# Patient Record
Sex: Female | Born: 1945 | Race: White | Hispanic: No | Marital: Married | Smoking: Former smoker
Health system: Southern US, Community
[De-identification: ages and names within clinical notes are randomized; demographics above are authoritative.]

## PROBLEM LIST (undated history)

## (undated) HISTORY — PX: APPENDECTOMY: SHX54

## (undated) HISTORY — PX: WISDOM TOOTH EXTRACTION: SHX21

---

## 1999-04-17 ENCOUNTER — Other Ambulatory Visit: Admission: RE | Admit: 1999-04-17 | Discharge: 1999-04-17 | Payer: Self-pay | Admitting: Obstetrics and Gynecology

## 2001-02-10 ENCOUNTER — Other Ambulatory Visit: Admission: RE | Admit: 2001-02-10 | Discharge: 2001-02-10 | Payer: Self-pay | Admitting: Obstetrics and Gynecology

## 2003-02-27 ENCOUNTER — Other Ambulatory Visit: Admission: RE | Admit: 2003-02-27 | Discharge: 2003-02-27 | Payer: Self-pay | Admitting: Obstetrics and Gynecology

## 2004-09-14 ENCOUNTER — Ambulatory Visit: Payer: Self-pay | Admitting: Internal Medicine

## 2004-10-13 ENCOUNTER — Ambulatory Visit: Payer: Self-pay | Admitting: Internal Medicine

## 2004-12-23 ENCOUNTER — Other Ambulatory Visit: Admission: RE | Admit: 2004-12-23 | Discharge: 2004-12-23 | Payer: Self-pay | Admitting: Obstetrics and Gynecology

## 2004-12-24 ENCOUNTER — Ambulatory Visit: Payer: Self-pay | Admitting: Internal Medicine

## 2004-12-24 ENCOUNTER — Encounter: Admission: RE | Admit: 2004-12-24 | Discharge: 2004-12-24 | Payer: Self-pay | Admitting: Obstetrics and Gynecology

## 2004-12-31 ENCOUNTER — Ambulatory Visit: Payer: Self-pay | Admitting: Internal Medicine

## 2006-01-26 ENCOUNTER — Ambulatory Visit: Payer: Self-pay | Admitting: Internal Medicine

## 2006-02-02 ENCOUNTER — Ambulatory Visit: Payer: Self-pay | Admitting: Internal Medicine

## 2006-04-28 ENCOUNTER — Ambulatory Visit: Payer: Self-pay | Admitting: Internal Medicine

## 2006-05-05 ENCOUNTER — Ambulatory Visit: Payer: Self-pay | Admitting: Internal Medicine

## 2007-02-08 ENCOUNTER — Ambulatory Visit: Payer: Self-pay | Admitting: Internal Medicine

## 2007-02-08 LAB — CONVERTED CEMR LAB
ALT: 14 units/L (ref 0–35)
AST: 19 units/L (ref 0–37)
Albumin: 3.6 g/dL (ref 3.5–5.2)
Alkaline Phosphatase: 50 units/L (ref 39–117)
BUN: 14 mg/dL (ref 6–23)
Basophils Absolute: 0 10*3/uL (ref 0.0–0.1)
Basophils Relative: 0 % (ref 0.0–1.0)
Bilirubin, Direct: 0.1 mg/dL (ref 0.0–0.3)
CO2: 30 meq/L (ref 19–32)
Calcium: 8.8 mg/dL (ref 8.4–10.5)
Chloride: 103 meq/L (ref 96–112)
Cholesterol: 269 mg/dL (ref 0–200)
Creatinine, Ser: 0.7 mg/dL (ref 0.4–1.2)
Direct LDL: 173.7 mg/dL
Eosinophils Absolute: 0.1 10*3/uL (ref 0.0–0.6)
Eosinophils Relative: 2.3 % (ref 0.0–5.0)
GFR calc Af Amer: 110 mL/min
GFR calc non Af Amer: 91 mL/min
Glucose, Bld: 78 mg/dL (ref 70–99)
HCT: 40.4 % (ref 36.0–46.0)
HDL: 65.7 mg/dL (ref >39.0)
Hemoglobin: 13.9 g/dL (ref 12.0–15.0)
Lymphocytes Relative: 36.7 % (ref 12.0–46.0)
MCHC: 34.5 g/dL (ref 30.0–36.0)
MCV: 87.4 fL (ref 78.0–100.0)
Monocytes Absolute: 0.3 10*3/uL (ref 0.2–0.7)
Monocytes Relative: 5.4 % (ref 3.0–11.0)
Neutro Abs: 3 10*3/uL (ref 1.4–7.7)
Neutrophils Relative %: 55.6 % (ref 43.0–77.0)
Platelets: 329 10*3/uL (ref 150–400)
Potassium: 4.2 meq/L (ref 3.5–5.1)
RBC: 4.62 M/uL (ref 3.87–5.11)
RDW: 12.3 % (ref 11.5–14.6)
Sodium: 142 meq/L (ref 135–145)
TSH: 1.84 microintl units/mL (ref 0.35–5.50)
Total Bilirubin: 0.6 mg/dL (ref 0.3–1.2)
Total CHOL/HDL Ratio: 4.1
Total Protein: 6.9 g/dL (ref 6.0–8.3)
Triglycerides: 63 mg/dL (ref 0–149)
VLDL: 13 mg/dL (ref 0–40)
WBC: 5.3 10*3/uL (ref 4.5–10.5)

## 2007-02-13 ENCOUNTER — Ambulatory Visit: Payer: Self-pay | Admitting: Internal Medicine

## 2007-02-24 DIAGNOSIS — K5730 Diverticulosis of large intestine without perforation or abscess without bleeding: Secondary | ICD-10-CM | POA: Insufficient documentation

## 2007-02-24 DIAGNOSIS — E785 Hyperlipidemia, unspecified: Secondary | ICD-10-CM | POA: Insufficient documentation

## 2008-02-06 ENCOUNTER — Ambulatory Visit: Payer: Self-pay | Admitting: Internal Medicine

## 2008-02-06 DIAGNOSIS — M25519 Pain in unspecified shoulder: Secondary | ICD-10-CM | POA: Insufficient documentation

## 2009-03-06 ENCOUNTER — Ambulatory Visit: Payer: Self-pay | Admitting: Internal Medicine

## 2009-03-06 DIAGNOSIS — M6740 Ganglion, unspecified site: Secondary | ICD-10-CM

## 2009-03-07 ENCOUNTER — Telehealth: Payer: Self-pay | Admitting: Internal Medicine

## 2010-08-25 ENCOUNTER — Ambulatory Visit
Admission: RE | Admit: 2010-08-25 | Discharge: 2010-08-25 | Payer: Self-pay | Source: Home / Self Care | Attending: Internal Medicine | Admitting: Internal Medicine

## 2010-09-17 NOTE — Assessment & Plan Note (Signed)
Summary: lump on wrist//ccm   Vital Signs:  Patient profile:   65 year old female Height:      60 inches Weight:      154 pounds BMI:     30.18 Temp:     99.7 degrees F oral BP sitting:   122 / 90  (left arm) Cuff size:   regular  Vitals Entered By: Azucena Freed  MA Student CC: ganglion cyst on lt wrist x 1.5 yrs Is Patient Diabetic? No   CC:  ganglion cyst on lt wrist x 1.5 yrs.  Current Medications (verified): 1)  None  Allergies (verified): No Known Drug Allergies   Impression & Recommendations:  Problem # 1:  GANGLION CYST, WRIST, LEFT (ICD-727.41) 15 minute discussion all face to face  she has a small ganglion cys she is reassured---no need for further evaluation   Orders Added: 1)  Est. Patient Level III [16109]

## 2012-12-07 ENCOUNTER — Encounter: Payer: Self-pay | Admitting: Gastroenterology

## 2013-11-09 DIAGNOSIS — H35379 Puckering of macula, unspecified eye: Secondary | ICD-10-CM | POA: Diagnosis not present

## 2013-11-09 DIAGNOSIS — H2510 Age-related nuclear cataract, unspecified eye: Secondary | ICD-10-CM | POA: Diagnosis not present

## 2013-11-13 DIAGNOSIS — H35359 Cystoid macular degeneration, unspecified eye: Secondary | ICD-10-CM | POA: Diagnosis not present

## 2013-11-13 DIAGNOSIS — IMO0002 Reserved for concepts with insufficient information to code with codable children: Secondary | ICD-10-CM | POA: Diagnosis not present

## 2013-11-13 DIAGNOSIS — H35379 Puckering of macula, unspecified eye: Secondary | ICD-10-CM | POA: Diagnosis not present

## 2013-11-19 DIAGNOSIS — H35359 Cystoid macular degeneration, unspecified eye: Secondary | ICD-10-CM | POA: Diagnosis not present

## 2013-11-19 DIAGNOSIS — H269 Unspecified cataract: Secondary | ICD-10-CM | POA: Diagnosis not present

## 2013-11-19 DIAGNOSIS — H35379 Puckering of macula, unspecified eye: Secondary | ICD-10-CM | POA: Diagnosis not present

## 2013-12-21 DIAGNOSIS — J00 Acute nasopharyngitis [common cold]: Secondary | ICD-10-CM | POA: Diagnosis not present

## 2014-01-29 DIAGNOSIS — H2510 Age-related nuclear cataract, unspecified eye: Secondary | ICD-10-CM | POA: Diagnosis not present

## 2014-02-18 ENCOUNTER — Other Ambulatory Visit: Payer: Self-pay | Admitting: Dermatology

## 2014-02-18 DIAGNOSIS — Z85820 Personal history of malignant melanoma of skin: Secondary | ICD-10-CM | POA: Diagnosis not present

## 2014-02-18 DIAGNOSIS — L851 Acquired keratosis [keratoderma] palmaris et plantaris: Secondary | ICD-10-CM | POA: Diagnosis not present

## 2014-02-18 DIAGNOSIS — D485 Neoplasm of uncertain behavior of skin: Secondary | ICD-10-CM | POA: Diagnosis not present

## 2014-02-18 DIAGNOSIS — Z85828 Personal history of other malignant neoplasm of skin: Secondary | ICD-10-CM | POA: Diagnosis not present

## 2014-02-18 DIAGNOSIS — L821 Other seborrheic keratosis: Secondary | ICD-10-CM | POA: Diagnosis not present

## 2014-02-18 DIAGNOSIS — D239 Other benign neoplasm of skin, unspecified: Secondary | ICD-10-CM | POA: Diagnosis not present

## 2014-02-18 DIAGNOSIS — I781 Nevus, non-neoplastic: Secondary | ICD-10-CM | POA: Diagnosis not present

## 2014-03-14 ENCOUNTER — Encounter: Payer: Self-pay | Admitting: Gastroenterology

## 2014-05-27 DIAGNOSIS — H35373 Puckering of macula, bilateral: Secondary | ICD-10-CM | POA: Diagnosis not present

## 2014-05-27 DIAGNOSIS — H35353 Cystoid macular degeneration, bilateral: Secondary | ICD-10-CM | POA: Diagnosis not present

## 2014-10-23 DIAGNOSIS — M545 Low back pain: Secondary | ICD-10-CM | POA: Diagnosis not present

## 2014-10-23 DIAGNOSIS — M67979 Unspecified disorder of synovium and tendon, unspecified ankle and foot: Secondary | ICD-10-CM | POA: Diagnosis not present

## 2014-10-23 DIAGNOSIS — S300XXA Contusion of lower back and pelvis, initial encounter: Secondary | ICD-10-CM | POA: Diagnosis not present

## 2015-09-23 ENCOUNTER — Encounter (HOSPITAL_COMMUNITY): Payer: Self-pay

## 2015-09-23 ENCOUNTER — Emergency Department (HOSPITAL_COMMUNITY)
Admission: EM | Admit: 2015-09-23 | Discharge: 2015-09-23 | Disposition: A | Discharge status: Home / Self Care | Payer: Medicare Other | Attending: Emergency Medicine | Admitting: Emergency Medicine

## 2015-09-23 DIAGNOSIS — IMO0001 Reserved for inherently not codable concepts without codable children: Secondary | ICD-10-CM

## 2015-09-23 DIAGNOSIS — R030 Elevated blood-pressure reading, without diagnosis of hypertension: Secondary | ICD-10-CM | POA: Insufficient documentation

## 2015-09-23 DIAGNOSIS — K029 Dental caries, unspecified: Secondary | ICD-10-CM | POA: Insufficient documentation

## 2015-09-23 DIAGNOSIS — K068 Other specified disorders of gingiva and edentulous alveolar ridge: Secondary | ICD-10-CM

## 2015-09-23 DIAGNOSIS — Z87891 Personal history of nicotine dependence: Secondary | ICD-10-CM | POA: Diagnosis not present

## 2015-09-23 DIAGNOSIS — R042 Hemoptysis: Secondary | ICD-10-CM | POA: Diagnosis present

## 2015-09-23 LAB — BASIC METABOLIC PANEL
ANION GAP: 12 (ref 5–15)
BUN: 10 mg/dL (ref 6–20)
CALCIUM: 9.5 mg/dL (ref 8.9–10.3)
CHLORIDE: 102 mmol/L (ref 101–111)
CO2: 27 mmol/L (ref 22–32)
Creatinine, Ser: 0.68 mg/dL (ref 0.44–1.00)
GFR calc non Af Amer: >60 mL/min (ref >60)
GLUCOSE: 93 mg/dL (ref 65–99)
POTASSIUM: 4.4 mmol/L (ref 3.5–5.1)
Sodium: 141 mmol/L (ref 135–145)

## 2015-09-23 LAB — CBC
HCT: 43.6 % (ref 36.0–46.0)
HEMOGLOBIN: 14.6 g/dL (ref 12.0–15.0)
MCH: 29 pg (ref 26.0–34.0)
MCHC: 33.5 g/dL (ref 30.0–36.0)
MCV: 86.7 fL (ref 78.0–100.0)
Platelets: 320 10*3/uL (ref 150–400)
RBC: 5.03 MIL/uL (ref 3.87–5.11)
RDW: 13 % (ref 11.5–15.5)
WBC: 7.9 10*3/uL (ref 4.0–10.5)

## 2015-09-23 LAB — PROTIME-INR
INR: 1 (ref 0.00–1.49)
PROTHROMBIN TIME: 13.4 s (ref 11.6–15.2)

## 2015-09-23 NOTE — Discharge Instructions (Signed)
You were seen in the ED for oral bleeding.  It appears that you had gingival bleeding after brushing your teeth.  Prolonged bleeding can be explained by the 2 aspirin you took for your headache the night before.  Your blood labs were NORMAL.  You are NOT anemic.  I recommend that you establish with a new primary care provider for continued care and blood pressure follow up.  You should also visit your dentist soon for routine oral care.  Emergency Department Resource Guide 1) Find a Doctor and Pay Out of Pocket Although you won't have to find out who is covered by your insurance plan, it is a good idea to ask around and get recommendations. You will then need to call the office and see if the doctor you have chosen will accept you as a new patient and what types of options they offer for patients who are self-pay. Some doctors offer discounts or will set up payment plans for their patients who do not have insurance, but you will need to ask so you aren't surprised when you get to your appointment.  2) Contact Your Local Health Department Not all health departments have doctors that can see patients for sick visits, but many do, so it is worth a call to see if yours does. If you don't know where your local health department is, you can check in your phone book. The CDC also has a tool to help you locate your state's health department, and many state websites also have listings of all of their local health departments.  3) Find a [facility] If your illness is not likely to be very severe or complicated, you may want to try a walk in clinic. These are popping up all over the country in pharmacies, drugstores, and shopping centers. They're usually staffed by nurse practitioners or physician assistants that have been trained to treat common illnesses and complaints. They're usually fairly quick and inexpensive. However, if you have serious medical issues or chronic medical problems, these are probably not  your best option.  No Primary Care Doctor: - Call Health Connect at  [phone] - they can help you locate a primary care doctor that  accepts your insurance, provides certain services, etc. - Physician Referral Service- [phone]  Chronic Pain Problems: Organization         Address  Phone   Notes  [facility]  [phone] Patients need to be referred by their primary care doctor.   Medication Assistance: Organization         Address  Phone   Notes  [facility] Medication [facility] [address] [place], [place] [address] [phone] --Must be a resident of [facility] -- Must have NO insurance coverage whatsoever (no Medicaid/ Medicare, etc.) -- The pt. MUST have a primary care doctor that directs their care regularly and follows them in the community   Med[facility]ist  [phone]   [employer]  [phone]    Agencies that provide inexpensive medical care: Organization         Address  Phone   Notes  [place]  [phone]   [facility]    [phone]   [facility] [place], [place] [address] [phone]   [place] [address] [place] [phone]   Planned Parenthood    [phone]   [facility]    [phone]   [place] [address], [place] Phone:  [phone], Fax:  [phone] Hours of Operation:  9 am - 6 pm, M-F.  Also accepts Medicaid/Medicare and self-pay.  [facility] for Kaka [address], [address], [place] Phone: [phone], Fax: [phone]. Hours of Operation:  8:30 am - 5:30 pm, M-F.  Also accepts Medicaid and self-pay.  [facility] [place] [address], [place] Phone: [phone]   [place], [place], [place] [phone], Ext. [phone] Mondays & Thursdays: 7-9 AM.  First  15 patients are seen on a first come, first serve basis.    [place] Providers:  Organization         Address  Phone   Notes  [facility] [address], [place] [phone] Also accepts self-pay patients.  [facility] [id number] [place], [place]  [phone]   [place], [address], [place] [phone]   [facility] [address], [place] [phone]   Lucianne Lei [address], [place]   [phone] Only accepts [place] Access [place] patients after they have their name applied to their card.   Self-Pay (no insurance) in [facility]):  Organization         Address  Phone   Notes  Sickle Cell Patients, [facility] [place] [phone]   [facility] [place] [phone]   [facility] [place]  [place], [place], [place] [phone]   [facility]/Dr. Osei-Bonsu  [address], [place] or [place] city  Dr, [address], [place] [phone] Phone number for both [place] and [place] locations is the same.  Urgent Medical and [facility] [address] [place] [phone]   [facility] [address] [place] or [address] [phone]   [facility] [address] [place] [phone], phone; [phone], fax Sees patients 1st and 3rd Saturday of every month.  Must not qualify for public or private insurance (i.e. Medicaid, Medicare, [place] Health Choice, Veterans' Benefits)  Household income should be no more than 200% of the poverty level The clinic cannot treat you if you are pregnant or think you are pregnant  Sexually transmitted diseases are not treated at the clinic.    Dental  Care: Organization         Address  Phone  Notes  [facility] of [facility] [place] [phone] Accepts children up to age 50 who are enrolled in [place] or [place]; pregnant women with a Medicaid card; and children who have applied for Medicaid or [place] Health Choice, but were declined, whose parents can pay a reduced fee at time of service.  [facility] Department of [facility]  [address], [place] [phone] Accepts children up to age 74 who are enrolled in [place] or [place]; pregnant women with a Medicaid card; and children who have applied for Medicaid or [place] Health Choice, but were declined, whose parents can pay a reduced fee at time of service.  [place]  Access PROGRAM  [place] [phone] Patients are seen by appointment only. Walk-ins are not accepted. [place] will see patients 53 years of age and older. Monday - Tuesday (8am-5pm) Most Wednesdays (8:30-5pm) $30 per visit, cash only  [facility] Adult Dental Access PROGRAM  [address], [facility] [phone] Patients are seen by appointment only. Walk-ins are not accepted. [place] will see patients 54 years of age and older. One Wednesday Evening (Monthly: Volunteer Based).  $30 per visit, cash only  [place]  [phone] for adults; Children under age 34, call Graduate Pediatric Dentistry at [phone]. Children aged 25-14, please call [phone] to request a pediatric application.  Dental services are provided in all areas of dental care including fillings, crowns and bridges, complete and partial dentures, implants, gum treatment, root canals, and extractions. Preventive care is also provided. Treatment is provided to both adults and children. Patients are selected via a lottery and there is often a waiting list.   [facility] [address] [place]  [phone] [email or web address]   [facility] [address] [place], [place] [phone], Ext. [phone] Second and Fourth Thursday of each month, opens at 6:30 AM; Clinic ends at 9 AM.  Patients are seen on a first-come first-served basis, and a limited number are seen during each clinic.   [facility]  [address] Hillard Danker [place], [place] [phone]   Eligibility Requirements You must have lived in [place], [place], or [place] counties for at least the last three months.   You cannot be eligible for state or federal sponsored [employer], including [employer], [place], or [employer].   You generally cannot be eligible for healthcare insurance through your employer.    How to apply: Eligibility screenings are held every Tuesday and Wednesday afternoon from 1:00 pm until 4:00 pm. You do not need an appointment for the interview!  [facility] [address], [place], [place]   [place]  [place] Department  [place]  [phone]    Behavioral Health Resources in the Community: Intensive Outpatient Programs Organization         Address  Phone  Notes  [facility] [place]. [address], [place], [place] [phone]   [facility] Outpatient [address] [place], [place]   ADS: Alcohol & Drug Svcs [address] [place], [place]   [place] [address],  [place], [place] or [phone]   Substance Abuse Resources Organization         Address  Phone  Notes  Alcohol and Drug Services  [phone]   [place]  [phone]   [facility] [place]   Chinita Pester  [phone]   Residential & Outpatient Substance Abuse Program  [phone]    Psychological Services Organization         Address  Phone  Notes  [facility] [place]  [place]  [phone]   [place] [address] [place] or [phone]    Mobile Crisis Teams Organization         Address  Phone  Notes  [facility], Mobile Crisis Care Unit  [phone]   [facility]  [address] [place],  [place] [address], [place] [phone]    Self-Help/Support Groups Organization         Address  Phone             Notes  [facility]ental [facility]oc[facility] of [place] - variety of support groups  [place] Call for more information  Narcotics Anonymous (NA), [facility] [address], [employer] [place]  2 meetings at this location   Special educational needs teacher         Address  Phone  Notes  [facility] [place],    [place]  [phone]   [facility]  [address], [place] [phone], [place], [place]   [place] [employer], [place] [phone] Admissions: 8am-3pm M-F  Incentives Substance [place] [address],    [place], [place] [phone]   [facility] [address] [place], [place], [place]   [facility] [address]  [place], [place]   [facility] - Intensive Outpatient [address], [place], [place]   [facility] ([place] [place].,  [place], [place] [phone] or [phone]   [facility] (RTS) [address], [place], [place] Accepts Medicaid  [facility] [place] [address]  [place] [phone] Substance Abuse/Addiction Treatment   [facility] Organization         Address  Phone  Notes  [facility]  [phone]   Domenic Schwab, PhD [address] Arlis Porta [place], [place]   [phone] or [phone]   [place], [place] [phone]   [facility] [address], [place], [place] [phone] Insurance/Medicaid/sponsorship through [facility] and Families [address], [address]                                    [place], [place] [phone] [place] [address], [place] [phone]    Dr. Adele Schilder  [phone]   [facility] Dept. 1) [address] [place] 2) [place] 3)  [place] [address], [place] [phone]  [phone]   [place] [phone] or [phone] (After Hours)       Managing Your High Blood Pressure Blood pressure is a measurement of how forceful your blood is pressing against the walls of the arteries. Arteries are muscular tubes within the circulatory system. Blood pressure does not stay the same. Blood pressure rises when you are active, excited, or nervous; and it lowers during sleep and relaxation. If the numbers measuring your blood pressure stay above normal most of the time, you are at risk for health problems. High blood pressure (hypertension) is a long-term (chronic) condition in which blood pressure is elevated. A blood pressure reading is recorded as two numbers, such as 120 over 80 (or 120/80). The first, higher number is called the systolic pressure. It is a measure of the pressure in your arteries as the heart beats. The second, lower number is called the diastolic pressure. It is a measure of the pressure in your arteries as  the heart relaxes between beats.  Keeping your blood pressure in a normal range is important to your overall health and prevention of health problems, such as heart disease and stroke. When your blood  pressure is uncontrolled, your heart has to work harder than normal. High blood pressure is a very common condition in adults because blood pressure tends to rise with age. Men and women are equally likely to have hypertension but at different times in life. Before age 25, men are more likely to have hypertension. After 70 years of age, women are more likely to have it. Hypertension is especially common in African Americans. This condition often has no signs or symptoms. The cause of the condition is usually not known. Your caregiver can help you come up with a plan to keep your blood pressure in a normal, healthy range. BLOOD PRESSURE STAGES Blood pressure is classified into four stages: normal, prehypertension, stage 1, and stage 2. Your blood pressure reading will be used to determine what type of treatment, if any, is necessary. Appropriate treatment options are tied to these four stages:  Normal  Systolic pressure (mm Hg): below 120.  Diastolic pressure (mm Hg): below 80. Prehypertension  Systolic pressure (mm Hg): 120 to 139.  Diastolic pressure (mm Hg): 80 to 89. Stage1  Systolic pressure (mm Hg): 140 to 159.  Diastolic pressure (mm Hg): 90 to 99. Stage2  Systolic pressure (mm Hg): 160 or above.  Diastolic pressure (mm Hg): 100 or above. RISKS RELATED TO HIGH BLOOD PRESSURE Managing your blood pressure is an important responsibility. Uncontrolled high blood pressure can lead to:  A heart attack.  A stroke.  A weakened blood vessel (aneurysm).  Heart failure.  Kidney damage.  Eye damage.  Metabolic syndrome.  Memory and concentration problems. HOW TO MANAGE YOUR BLOOD PRESSURE Blood pressure can be managed effectively with lifestyle changes and medicines (if needed). Your caregiver will help you come up with a plan to bring your blood pressure within a normal range. Your plan should include the following: Education  Read all information provided by your caregivers  about how to control blood pressure.  Educate yourself on the latest guidelines and treatment recommendations. New research is always being done to further define the risks and treatments for high blood pressure. Lifestylechanges  Control your weight.  Avoid smoking.  Stay physically active.  Reduce the amount of salt in your diet.  Reduce stress.  Control any chronic conditions, such as high cholesterol or diabetes.  Reduce your alcohol intake. Medicines  Several medicines (antihypertensive medicines) are available, if needed, to bring blood pressure within a normal range. Communication  Review all the medicines you take with your caregiver because there may be side effects or interactions.  Talk with your caregiver about your diet, exercise habits, and other lifestyle factors that may be contributing to high blood pressure.  See your caregiver regularly. Your caregiver can help you create and adjust your plan for managing high blood pressure. RECOMMENDATIONS FOR TREATMENT AND FOLLOW-UP  The following recommendations are based on current guidelines for managing high blood pressure in nonpregnant adults. Use these recommendations to identify the proper follow-up period or treatment option based on your blood pressure reading. You can discuss these options with your caregiver.  Systolic pressure of 123456 to [id number] or diastolic pressure of 80 to 89: Follow up with your caregiver as directed.  Systolic pressure of [id number] to 0000000 or diastolic pressure of 90 to 100: Follow up with your caregiver  within 2 months.  Systolic pressure above 0000000 or diastolic pressure above 123XX123: Follow up with your caregiver within 1 month.  Systolic pressure above [id number] or diastolic pressure above [id number]: Consider antihypertensive therapy; follow up with your caregiver within 1 week.  Systolic pressure above [id number] or diastolic pressure above 123456: Begin antihypertensive therapy; follow up with your caregiver within 1  week.   This information is not intended to replace advice given to you by your health care provider. Make sure you discuss any questions you have with your health care provider.   Document Released: 04/26/2012 Document Reviewed: 04/26/2012 Elsevier Interactive Patient Education [employer].

## 2015-09-23 NOTE — ED Notes (Addendum)
Pt reports she was brushing her teeth this afternoon and she rinsed her mouth and when she spit she noticed blood in her saliva. At first she thought it was from brushing her teeth but the blood tinged saliva continued. She took a shower and spit again and the blood tinged saliva began to appear a darker red color. Denies recent nose bleed. No bleeding now, only when she spits.

## 2015-09-23 NOTE — ED Provider Notes (Signed)
CSN: [id number]     Arrival date & time 09/23/15  1504 History   First MD Initiated Contact with Patient 09/23/15 2035     Chief Complaint  Patient presents with  . Blood in saliva    HPI  Madison Briggs is a 69 y.o. female that presents to ED with gingival bleeding.  She notes that bleeding happened after she ate some steel cut oats for lunch and proceeded to brush her teeth.  She notes that the can the oats came in required her to cut the lid.  She wonders if a small piece of metal may have gotten in her meal.  She reports that she had to chew the oats a lot and did not notice anything sharp or hard when eating.  She reports that she spit several times and her saliva continued to be pink tinged, so she came to the ED for evaluation.  She denies having had prolonged bleeding or easy bruising in the past.  She reports stress and some decreased energy.  No throat pain, palpitations, SOB, dizziness, weakness, melena, hematochezia, nausea, vomiting or abdominal pain.  She has no family h/o bleeding dyscrasias.  She does not routinely take medications but notes that she did take 2 aspirins last night for a headache.  She denies any medical conditions.  She does not have a PCP, as her old one no longer works as an Cytogeneticist.  History reviewed. No pertinent past medical history. Past Surgical History  Procedure Laterality Date  . Appendectomy    . Wisdom tooth extraction     No family history on file. Social History  Substance Use Topics  . Smoking status: Former Smoker    Quit date: 09/22/1985  . Smokeless tobacco: None  . Alcohol Use: Yes   OB History    No data available     Review of Systems  Constitutional: Negative for fatigue (notes decreased energy but able to do daily acitivites per normal).  HENT: Positive for dental problem (gingival bleeding). Negative for mouth sores, nosebleeds and sore throat.   Respiratory: Negative for shortness of breath.   Cardiovascular:  Negative for chest pain and palpitations.  Gastrointestinal: Negative for nausea, vomiting, abdominal pain and blood in stool.  Genitourinary: Negative for hematuria and vaginal bleeding.  Skin: Negative for pallor.  Neurological: Negative for weakness.   Allergies  Corn oil and Corn starch  Home Medications   Prior to Admission medications   Medication Sig Start Date End Date Taking? Authorizing Provider  aspirin EC 325 MG tablet Take 650 mg by mouth daily as needed for mild pain.   Yes Historical Provider, MD   BP 180/102 mmHg  Pulse 70  Temp(Src) 98.3 F (36.8 C) (Oral)  Resp 18  Ht 5\' 3"  (1.6 m)  Wt 67.586 kg  BMI 26.40 kg/m2  SpO2 96% Physical Exam  Constitutional: She is oriented to person, place, and time. She appears well-developed and well-nourished. No distress.  HENT:  Head: Normocephalic and atraumatic.  Nose: No epistaxis.  Mouth/Throat: Uvula is midline and oropharynx is clear and moist. Dental caries present. No oropharyngeal exudate.  No active gingival bleed or lacerations appreciated.  Eyes: Conjunctivae and EOM are normal.  Neck: Normal range of motion. Neck supple.  Cardiovascular: Normal rate, regular rhythm, normal heart sounds and intact distal pulses.   No murmur heard. Pulmonary/Chest: Effort normal and breath sounds normal. No respiratory distress. She has no wheezes.  Abdominal: Soft. Bowel sounds are normal.  She exhibits no distension. There is no tenderness. There is no rebound and no guarding.  Musculoskeletal: Normal range of motion. She exhibits no edema.  Neurological: She is alert and oriented to person, place, and time.  Skin: Skin is warm and dry. She is not diaphoretic. No pallor.   ED Course  Procedures (including critical care time) Labs Review Labs Reviewed  CBC  PROTIME-INR  BASIC METABOLIC PANEL   Results for orders placed or performed during the hospital encounter of 09/23/15 (from the past 24 hour(s))  CBC     Status: None    Collection Time: 09/23/15  9:02 PM  Result Value Ref Range   WBC 7.9 4.0 - 10.5 K/uL   RBC 5.03 3.87 - 5.11 MIL/uL   Hemoglobin 14.6 12.0 - 15.0 g/dL   HCT 43.6 36.0 - 46.0 %   MCV 86.7 78.0 - 100.0 fL   MCH 29.0 26.0 - 34.0 pg   MCHC 33.5 30.0 - 36.0 g/dL   RDW 13.0 11.5 - 15.5 %   Platelets 320 150 - 400 K/uL  Protime-INR     Status: None   Collection Time: 09/23/15  9:02 PM  Result Value Ref Range   Prothrombin Time 13.4 11.6 - 15.2 seconds   INR 1.00 0.00 - 99991111  Basic metabolic panel     Status: None   Collection Time: 09/23/15  9:02 PM  Result Value Ref Range   Sodium 141 135 - 145 mmol/L   Potassium 4.4 3.5 - 5.1 mmol/L   Chloride 102 101 - 111 mmol/L   CO2 27 22 - 32 mmol/L   Glucose, Bld 93 65 - 99 mg/dL   BUN 10 6 - 20 mg/dL   Creatinine, Ser 0.68 0.44 - 1.00 mg/dL   Calcium 9.5 8.9 - 10.3 mg/dL   GFR calc non Af Amer >60 >60 mL/min   GFR calc Af Amer >60 >60 mL/min   Anion gap 12 5 - 15   Imaging Review No results found. I have personally reviewed and evaluated these images and lab results as part of my medical decision-making.   EKG Interpretation None     MDM   Final diagnoses:  Gingival bleeding  Elevated blood pressure   2055: CBC, BMP, PT/INR ordered  2118: Labs reviewed, no evidence of anemia.  BUN normal.    Madison Briggs is a 70 y.o. female here with gingival bleeding that has resolved since coming to the ED.  I have low suspicion for bleeding disorder, given resolved gingival bleeding and absence of bleeding from other orifices.  Additionally, patient's labs stable with Hgb 14.6, Platelets 320, PT/INR within normal limits.   Blood pressure elevated in ED.  No h/o HTN though has not seen PCP in several years.  Discussed with patient that this would need to be followed up.  Patient anxious on exam, which is likely why her pressures are elevated.  Patient provided a list of primary care practices so that she may establish for routine outpatient  care/ blood pressure follow up.  Patient encouraged to follow up with her dentist for oral care.  Return precautions reviewed.  Patient voiced good understanding.  Janora Norlander, DO 09/23/15 2144  Lajean Saver, MD 09/24/15 [phone]

## 2016-04-22 DIAGNOSIS — H43813 Vitreous degeneration, bilateral: Secondary | ICD-10-CM | POA: Diagnosis not present

## 2016-04-22 DIAGNOSIS — H35353 Cystoid macular degeneration, bilateral: Secondary | ICD-10-CM | POA: Diagnosis not present

## 2016-04-22 DIAGNOSIS — H2513 Age-related nuclear cataract, bilateral: Secondary | ICD-10-CM | POA: Diagnosis not present

## 2016-04-22 DIAGNOSIS — H33192 Other retinoschisis and retinal cysts, left eye: Secondary | ICD-10-CM | POA: Diagnosis not present

## 2016-04-22 DIAGNOSIS — H35373 Puckering of macula, bilateral: Secondary | ICD-10-CM | POA: Diagnosis not present

## 2016-04-28 DIAGNOSIS — H2513 Age-related nuclear cataract, bilateral: Secondary | ICD-10-CM | POA: Diagnosis not present

## 2016-04-28 DIAGNOSIS — H35373 Puckering of macula, bilateral: Secondary | ICD-10-CM | POA: Diagnosis not present

## 2016-04-28 DIAGNOSIS — H35353 Cystoid macular degeneration, bilateral: Secondary | ICD-10-CM | POA: Diagnosis not present

## 2016-05-14 ENCOUNTER — Encounter: Payer: Self-pay | Admitting: Gastroenterology

## 2016-05-25 ENCOUNTER — Other Ambulatory Visit: Payer: Self-pay | Admitting: Internal Medicine

## 2016-05-25 ENCOUNTER — Ambulatory Visit
Admission: RE | Admit: 2016-05-25 | Discharge: 2016-05-25 | Disposition: A | Discharge status: Home / Self Care | Payer: Medicare Other | Source: Ambulatory Visit | Attending: Internal Medicine | Admitting: Internal Medicine

## 2016-05-25 DIAGNOSIS — Z136 Encounter for screening for cardiovascular disorders: Secondary | ICD-10-CM | POA: Diagnosis not present

## 2016-05-25 DIAGNOSIS — M545 Low back pain: Secondary | ICD-10-CM | POA: Diagnosis not present

## 2016-05-25 DIAGNOSIS — G8929 Other chronic pain: Secondary | ICD-10-CM | POA: Diagnosis not present

## 2016-05-25 DIAGNOSIS — Z1211 Encounter for screening for malignant neoplasm of colon: Secondary | ICD-10-CM | POA: Diagnosis not present

## 2016-05-25 DIAGNOSIS — Z23 Encounter for immunization: Secondary | ICD-10-CM | POA: Diagnosis not present

## 2016-07-14 ENCOUNTER — Encounter: Payer: Medicare Other | Admitting: Gastroenterology

## 2016-07-28 DIAGNOSIS — H35373 Puckering of macula, bilateral: Secondary | ICD-10-CM | POA: Diagnosis not present

## 2016-07-28 DIAGNOSIS — H2513 Age-related nuclear cataract, bilateral: Secondary | ICD-10-CM | POA: Diagnosis not present

## 2016-07-30 DIAGNOSIS — H35373 Puckering of macula, bilateral: Secondary | ICD-10-CM | POA: Diagnosis not present

## 2016-07-30 DIAGNOSIS — H2513 Age-related nuclear cataract, bilateral: Secondary | ICD-10-CM | POA: Diagnosis not present

## 2016-08-23 DIAGNOSIS — Z91018 Allergy to other foods: Secondary | ICD-10-CM | POA: Diagnosis not present

## 2016-08-23 DIAGNOSIS — Z961 Presence of intraocular lens: Secondary | ICD-10-CM | POA: Diagnosis not present

## 2016-08-23 DIAGNOSIS — K219 Gastro-esophageal reflux disease without esophagitis: Secondary | ICD-10-CM | POA: Diagnosis not present

## 2016-08-23 DIAGNOSIS — H2511 Age-related nuclear cataract, right eye: Secondary | ICD-10-CM | POA: Diagnosis not present

## 2016-08-23 DIAGNOSIS — H2513 Age-related nuclear cataract, bilateral: Secondary | ICD-10-CM | POA: Diagnosis not present

## 2016-08-23 DIAGNOSIS — H2512 Age-related nuclear cataract, left eye: Secondary | ICD-10-CM | POA: Diagnosis not present

## 2016-08-23 DIAGNOSIS — Z87891 Personal history of nicotine dependence: Secondary | ICD-10-CM | POA: Diagnosis not present

## 2016-08-23 DIAGNOSIS — H35373 Puckering of macula, bilateral: Secondary | ICD-10-CM | POA: Diagnosis not present

## 2016-09-07 DIAGNOSIS — E7800 Pure hypercholesterolemia, unspecified: Secondary | ICD-10-CM | POA: Diagnosis not present

## 2016-09-07 DIAGNOSIS — Z1389 Encounter for screening for other disorder: Secondary | ICD-10-CM | POA: Diagnosis not present

## 2016-09-07 DIAGNOSIS — Z0000 Encounter for general adult medical examination without abnormal findings: Secondary | ICD-10-CM | POA: Diagnosis not present

## 2016-09-13 DIAGNOSIS — Z780 Asymptomatic menopausal state: Secondary | ICD-10-CM | POA: Diagnosis not present

## 2016-09-13 DIAGNOSIS — M810 Age-related osteoporosis without current pathological fracture: Secondary | ICD-10-CM | POA: Diagnosis not present

## 2016-10-04 DIAGNOSIS — H2511 Age-related nuclear cataract, right eye: Secondary | ICD-10-CM | POA: Diagnosis not present

## 2016-10-04 DIAGNOSIS — H52221 Regular astigmatism, right eye: Secondary | ICD-10-CM | POA: Diagnosis not present

## 2016-10-04 DIAGNOSIS — Z85820 Personal history of malignant melanoma of skin: Secondary | ICD-10-CM | POA: Diagnosis not present

## 2016-10-04 DIAGNOSIS — K219 Gastro-esophageal reflux disease without esophagitis: Secondary | ICD-10-CM | POA: Diagnosis not present

## 2016-10-04 DIAGNOSIS — Z7982 Long term (current) use of aspirin: Secondary | ICD-10-CM | POA: Diagnosis not present

## 2016-10-04 DIAGNOSIS — Z87891 Personal history of nicotine dependence: Secondary | ICD-10-CM | POA: Diagnosis not present

## 2016-10-04 DIAGNOSIS — Z961 Presence of intraocular lens: Secondary | ICD-10-CM | POA: Diagnosis not present

## 2016-10-04 DIAGNOSIS — Z9842 Cataract extraction status, left eye: Secondary | ICD-10-CM | POA: Diagnosis not present

## 2016-10-12 DIAGNOSIS — E538 Deficiency of other specified B group vitamins: Secondary | ICD-10-CM | POA: Diagnosis not present

## 2016-10-12 DIAGNOSIS — Z79899 Other long term (current) drug therapy: Secondary | ICD-10-CM | POA: Diagnosis not present

## 2016-10-12 DIAGNOSIS — M810 Age-related osteoporosis without current pathological fracture: Secondary | ICD-10-CM | POA: Diagnosis not present

## 2016-11-25 DIAGNOSIS — H35373 Puckering of macula, bilateral: Secondary | ICD-10-CM | POA: Diagnosis not present

## 2016-11-25 DIAGNOSIS — H35353 Cystoid macular degeneration, bilateral: Secondary | ICD-10-CM | POA: Diagnosis not present

## 2016-11-25 DIAGNOSIS — H43813 Vitreous degeneration, bilateral: Secondary | ICD-10-CM | POA: Diagnosis not present

## 2017-01-17 DIAGNOSIS — E559 Vitamin D deficiency, unspecified: Secondary | ICD-10-CM | POA: Diagnosis not present

## 2017-06-02 DIAGNOSIS — H43813 Vitreous degeneration, bilateral: Secondary | ICD-10-CM | POA: Diagnosis not present

## 2017-06-02 DIAGNOSIS — H35353 Cystoid macular degeneration, bilateral: Secondary | ICD-10-CM | POA: Diagnosis not present

## 2017-06-02 DIAGNOSIS — H35373 Puckering of macula, bilateral: Secondary | ICD-10-CM | POA: Diagnosis not present

## 2017-09-09 ENCOUNTER — Other Ambulatory Visit: Payer: Self-pay | Admitting: Internal Medicine

## 2017-09-09 DIAGNOSIS — Z1389 Encounter for screening for other disorder: Secondary | ICD-10-CM | POA: Diagnosis not present

## 2017-09-09 DIAGNOSIS — Z1231 Encounter for screening mammogram for malignant neoplasm of breast: Secondary | ICD-10-CM

## 2017-09-09 DIAGNOSIS — E7800 Pure hypercholesterolemia, unspecified: Secondary | ICD-10-CM | POA: Diagnosis not present

## 2017-09-09 DIAGNOSIS — M810 Age-related osteoporosis without current pathological fracture: Secondary | ICD-10-CM | POA: Diagnosis not present

## 2017-09-09 DIAGNOSIS — Z23 Encounter for immunization: Secondary | ICD-10-CM | POA: Diagnosis not present

## 2017-09-09 DIAGNOSIS — Z85820 Personal history of malignant melanoma of skin: Secondary | ICD-10-CM | POA: Diagnosis not present

## 2017-09-09 DIAGNOSIS — Z0000 Encounter for general adult medical examination without abnormal findings: Secondary | ICD-10-CM | POA: Diagnosis not present

## 2017-10-11 ENCOUNTER — Ambulatory Visit
Admission: RE | Admit: 2017-10-11 | Discharge: 2017-10-11 | Disposition: A | Discharge status: Home / Self Care | Payer: Medicare Other | Source: Ambulatory Visit | Attending: Internal Medicine | Admitting: Internal Medicine

## 2017-10-11 DIAGNOSIS — Z1231 Encounter for screening mammogram for malignant neoplasm of breast: Secondary | ICD-10-CM

## 2017-11-08 DIAGNOSIS — H35373 Puckering of macula, bilateral: Secondary | ICD-10-CM | POA: Diagnosis not present

## 2017-11-08 DIAGNOSIS — H11003 Unspecified pterygium of eye, bilateral: Secondary | ICD-10-CM | POA: Diagnosis not present

## 2017-11-08 DIAGNOSIS — H527 Unspecified disorder of refraction: Secondary | ICD-10-CM | POA: Diagnosis not present

## 2017-11-08 DIAGNOSIS — Z961 Presence of intraocular lens: Secondary | ICD-10-CM | POA: Diagnosis not present

## 2017-11-28 DIAGNOSIS — D225 Melanocytic nevi of trunk: Secondary | ICD-10-CM | POA: Diagnosis not present

## 2017-11-28 DIAGNOSIS — Z85828 Personal history of other malignant neoplasm of skin: Secondary | ICD-10-CM | POA: Diagnosis not present

## 2017-11-28 DIAGNOSIS — L570 Actinic keratosis: Secondary | ICD-10-CM | POA: Diagnosis not present

## 2017-11-28 DIAGNOSIS — D1801 Hemangioma of skin and subcutaneous tissue: Secondary | ICD-10-CM | POA: Diagnosis not present

## 2017-11-28 DIAGNOSIS — D0359 Melanoma in situ of other part of trunk: Secondary | ICD-10-CM | POA: Diagnosis not present

## 2017-11-28 DIAGNOSIS — Z85820 Personal history of malignant melanoma of skin: Secondary | ICD-10-CM | POA: Diagnosis not present

## 2017-11-28 DIAGNOSIS — D2272 Melanocytic nevi of left lower limb, including hip: Secondary | ICD-10-CM | POA: Diagnosis not present

## 2017-11-28 DIAGNOSIS — C44319 Basal cell carcinoma of skin of other parts of face: Secondary | ICD-10-CM | POA: Diagnosis not present

## 2017-11-28 DIAGNOSIS — L718 Other rosacea: Secondary | ICD-10-CM | POA: Diagnosis not present

## 2017-12-21 DIAGNOSIS — Z85820 Personal history of malignant melanoma of skin: Secondary | ICD-10-CM | POA: Diagnosis not present

## 2017-12-21 DIAGNOSIS — C44319 Basal cell carcinoma of skin of other parts of face: Secondary | ICD-10-CM | POA: Diagnosis not present

## 2017-12-21 DIAGNOSIS — Z85828 Personal history of other malignant neoplasm of skin: Secondary | ICD-10-CM | POA: Diagnosis not present

## 2017-12-27 DIAGNOSIS — Z85820 Personal history of malignant melanoma of skin: Secondary | ICD-10-CM | POA: Diagnosis not present

## 2017-12-27 DIAGNOSIS — D0359 Melanoma in situ of other part of trunk: Secondary | ICD-10-CM | POA: Diagnosis not present

## 2017-12-27 DIAGNOSIS — Z85828 Personal history of other malignant neoplasm of skin: Secondary | ICD-10-CM | POA: Diagnosis not present

## 2018-02-23 DIAGNOSIS — Z1211 Encounter for screening for malignant neoplasm of colon: Secondary | ICD-10-CM | POA: Diagnosis not present

## 2018-02-23 DIAGNOSIS — R030 Elevated blood-pressure reading, without diagnosis of hypertension: Secondary | ICD-10-CM | POA: Diagnosis not present

## 2018-02-23 DIAGNOSIS — K219 Gastro-esophageal reflux disease without esophagitis: Secondary | ICD-10-CM | POA: Diagnosis not present

## 2018-03-02 DIAGNOSIS — H43813 Vitreous degeneration, bilateral: Secondary | ICD-10-CM | POA: Diagnosis not present

## 2018-03-02 DIAGNOSIS — H35353 Cystoid macular degeneration, bilateral: Secondary | ICD-10-CM | POA: Diagnosis not present

## 2018-03-02 DIAGNOSIS — Z83518 Family history of other specified eye disorder: Secondary | ICD-10-CM | POA: Diagnosis not present

## 2018-03-02 DIAGNOSIS — H35373 Puckering of macula, bilateral: Secondary | ICD-10-CM | POA: Diagnosis not present

## 2018-04-07 DIAGNOSIS — K219 Gastro-esophageal reflux disease without esophagitis: Secondary | ICD-10-CM | POA: Diagnosis not present

## 2018-04-07 DIAGNOSIS — Z1211 Encounter for screening for malignant neoplasm of colon: Secondary | ICD-10-CM | POA: Diagnosis not present

## 2018-04-10 DIAGNOSIS — K449 Diaphragmatic hernia without obstruction or gangrene: Secondary | ICD-10-CM | POA: Diagnosis not present

## 2018-04-10 DIAGNOSIS — K5730 Diverticulosis of large intestine without perforation or abscess without bleeding: Secondary | ICD-10-CM | POA: Diagnosis not present

## 2018-04-10 DIAGNOSIS — K635 Polyp of colon: Secondary | ICD-10-CM | POA: Diagnosis not present

## 2018-04-10 DIAGNOSIS — K219 Gastro-esophageal reflux disease without esophagitis: Secondary | ICD-10-CM | POA: Diagnosis not present

## 2018-04-10 DIAGNOSIS — D126 Benign neoplasm of colon, unspecified: Secondary | ICD-10-CM | POA: Diagnosis not present

## 2018-04-10 DIAGNOSIS — Z1211 Encounter for screening for malignant neoplasm of colon: Secondary | ICD-10-CM | POA: Diagnosis not present

## 2018-04-10 DIAGNOSIS — K222 Esophageal obstruction: Secondary | ICD-10-CM | POA: Diagnosis not present

## 2018-04-12 DIAGNOSIS — D126 Benign neoplasm of colon, unspecified: Secondary | ICD-10-CM | POA: Diagnosis not present

## 2018-04-12 DIAGNOSIS — K635 Polyp of colon: Secondary | ICD-10-CM | POA: Diagnosis not present

## 2018-07-21 DIAGNOSIS — L821 Other seborrheic keratosis: Secondary | ICD-10-CM | POA: Diagnosis not present

## 2018-07-21 DIAGNOSIS — D2261 Melanocytic nevi of right upper limb, including shoulder: Secondary | ICD-10-CM | POA: Diagnosis not present

## 2018-07-21 DIAGNOSIS — C44712 Basal cell carcinoma of skin of right lower limb, including hip: Secondary | ICD-10-CM | POA: Diagnosis not present

## 2018-07-21 DIAGNOSIS — Z85828 Personal history of other malignant neoplasm of skin: Secondary | ICD-10-CM | POA: Diagnosis not present

## 2018-07-21 DIAGNOSIS — C44519 Basal cell carcinoma of skin of other part of trunk: Secondary | ICD-10-CM | POA: Diagnosis not present

## 2018-07-21 DIAGNOSIS — L570 Actinic keratosis: Secondary | ICD-10-CM | POA: Diagnosis not present

## 2018-07-21 DIAGNOSIS — L814 Other melanin hyperpigmentation: Secondary | ICD-10-CM | POA: Diagnosis not present

## 2018-07-21 DIAGNOSIS — Z85820 Personal history of malignant melanoma of skin: Secondary | ICD-10-CM | POA: Diagnosis not present

## 2018-07-21 DIAGNOSIS — D1801 Hemangioma of skin and subcutaneous tissue: Secondary | ICD-10-CM | POA: Diagnosis not present

## 2018-09-12 DIAGNOSIS — Z0000 Encounter for general adult medical examination without abnormal findings: Secondary | ICD-10-CM | POA: Diagnosis not present

## 2018-09-12 DIAGNOSIS — E559 Vitamin D deficiency, unspecified: Secondary | ICD-10-CM | POA: Diagnosis not present

## 2018-09-12 DIAGNOSIS — R030 Elevated blood-pressure reading, without diagnosis of hypertension: Secondary | ICD-10-CM | POA: Diagnosis not present

## 2018-09-12 DIAGNOSIS — Z1159 Encounter for screening for other viral diseases: Secondary | ICD-10-CM | POA: Diagnosis not present

## 2018-09-12 DIAGNOSIS — E7800 Pure hypercholesterolemia, unspecified: Secondary | ICD-10-CM | POA: Diagnosis not present

## 2018-09-12 DIAGNOSIS — Z1389 Encounter for screening for other disorder: Secondary | ICD-10-CM | POA: Diagnosis not present

## 2018-09-12 DIAGNOSIS — M810 Age-related osteoporosis without current pathological fracture: Secondary | ICD-10-CM | POA: Diagnosis not present

## 2018-09-12 DIAGNOSIS — N949 Unspecified condition associated with female genital organs and menstrual cycle: Secondary | ICD-10-CM | POA: Diagnosis not present

## 2018-09-12 DIAGNOSIS — Z23 Encounter for immunization: Secondary | ICD-10-CM | POA: Diagnosis not present

## 2018-09-29 DIAGNOSIS — E7800 Pure hypercholesterolemia, unspecified: Secondary | ICD-10-CM | POA: Diagnosis not present

## 2018-09-29 DIAGNOSIS — F419 Anxiety disorder, unspecified: Secondary | ICD-10-CM | POA: Diagnosis not present

## 2018-09-29 DIAGNOSIS — R030 Elevated blood-pressure reading, without diagnosis of hypertension: Secondary | ICD-10-CM | POA: Diagnosis not present

## 2018-09-29 DIAGNOSIS — R0683 Snoring: Secondary | ICD-10-CM | POA: Diagnosis not present

## 2018-10-17 DIAGNOSIS — N8189 Other female genital prolapse: Secondary | ICD-10-CM | POA: Diagnosis not present

## 2018-10-17 DIAGNOSIS — N898 Other specified noninflammatory disorders of vagina: Secondary | ICD-10-CM | POA: Diagnosis not present

## 2018-10-18 DIAGNOSIS — F419 Anxiety disorder, unspecified: Secondary | ICD-10-CM | POA: Diagnosis not present

## 2018-10-18 DIAGNOSIS — E7800 Pure hypercholesterolemia, unspecified: Secondary | ICD-10-CM | POA: Diagnosis not present

## 2018-10-18 DIAGNOSIS — R030 Elevated blood-pressure reading, without diagnosis of hypertension: Secondary | ICD-10-CM | POA: Diagnosis not present

## 2018-10-18 DIAGNOSIS — N8189 Other female genital prolapse: Secondary | ICD-10-CM | POA: Diagnosis not present

## 2019-03-20 DIAGNOSIS — E7800 Pure hypercholesterolemia, unspecified: Secondary | ICD-10-CM | POA: Diagnosis not present

## 2019-03-20 DIAGNOSIS — R030 Elevated blood-pressure reading, without diagnosis of hypertension: Secondary | ICD-10-CM | POA: Diagnosis not present

## 2019-03-20 DIAGNOSIS — R0683 Snoring: Secondary | ICD-10-CM | POA: Diagnosis not present

## 2019-12-11 ENCOUNTER — Other Ambulatory Visit: Payer: Self-pay | Admitting: Internal Medicine

## 2019-12-11 DIAGNOSIS — M810 Age-related osteoporosis without current pathological fracture: Secondary | ICD-10-CM

## 2019-12-11 DIAGNOSIS — E7800 Pure hypercholesterolemia, unspecified: Secondary | ICD-10-CM

## 2019-12-11 DIAGNOSIS — Z1389 Encounter for screening for other disorder: Secondary | ICD-10-CM | POA: Diagnosis not present

## 2019-12-11 DIAGNOSIS — E559 Vitamin D deficiency, unspecified: Secondary | ICD-10-CM | POA: Diagnosis not present

## 2019-12-11 DIAGNOSIS — Z85820 Personal history of malignant melanoma of skin: Secondary | ICD-10-CM | POA: Diagnosis not present

## 2019-12-11 DIAGNOSIS — Z1231 Encounter for screening mammogram for malignant neoplasm of breast: Secondary | ICD-10-CM

## 2019-12-11 DIAGNOSIS — Z0000 Encounter for general adult medical examination without abnormal findings: Secondary | ICD-10-CM | POA: Diagnosis not present

## 2019-12-14 ENCOUNTER — Other Ambulatory Visit: Payer: Self-pay

## 2019-12-14 ENCOUNTER — Ambulatory Visit
Admission: RE | Admit: 2019-12-14 | Discharge: 2019-12-14 | Disposition: A | Discharge status: Home / Self Care | Payer: Medicare Other | Source: Ambulatory Visit | Attending: Internal Medicine | Admitting: Internal Medicine

## 2019-12-14 DIAGNOSIS — Z1231 Encounter for screening mammogram for malignant neoplasm of breast: Secondary | ICD-10-CM | POA: Diagnosis not present

## 2019-12-28 ENCOUNTER — Ambulatory Visit
Admission: RE | Admit: 2019-12-28 | Discharge: 2019-12-28 | Disposition: A | Discharge status: Home / Self Care | Payer: No Typology Code available for payment source | Source: Ambulatory Visit | Attending: Internal Medicine | Admitting: Internal Medicine

## 2019-12-28 DIAGNOSIS — E7800 Pure hypercholesterolemia, unspecified: Secondary | ICD-10-CM

## 2020-01-04 DIAGNOSIS — Z85820 Personal history of malignant melanoma of skin: Secondary | ICD-10-CM | POA: Diagnosis not present

## 2020-01-04 DIAGNOSIS — L814 Other melanin hyperpigmentation: Secondary | ICD-10-CM | POA: Diagnosis not present

## 2020-01-04 DIAGNOSIS — C44311 Basal cell carcinoma of skin of nose: Secondary | ICD-10-CM | POA: Diagnosis not present

## 2020-01-04 DIAGNOSIS — Z85828 Personal history of other malignant neoplasm of skin: Secondary | ICD-10-CM | POA: Diagnosis not present

## 2020-01-04 DIAGNOSIS — L821 Other seborrheic keratosis: Secondary | ICD-10-CM | POA: Diagnosis not present

## 2020-01-04 DIAGNOSIS — D225 Melanocytic nevi of trunk: Secondary | ICD-10-CM | POA: Diagnosis not present

## 2020-01-04 DIAGNOSIS — D224 Melanocytic nevi of scalp and neck: Secondary | ICD-10-CM | POA: Diagnosis not present

## 2020-01-04 DIAGNOSIS — L910 Hypertrophic scar: Secondary | ICD-10-CM | POA: Diagnosis not present

## 2020-01-04 DIAGNOSIS — D1801 Hemangioma of skin and subcutaneous tissue: Secondary | ICD-10-CM | POA: Diagnosis not present

## 2020-01-22 DIAGNOSIS — Z85820 Personal history of malignant melanoma of skin: Secondary | ICD-10-CM | POA: Diagnosis not present

## 2020-01-22 DIAGNOSIS — Z85828 Personal history of other malignant neoplasm of skin: Secondary | ICD-10-CM | POA: Diagnosis not present

## 2020-01-22 DIAGNOSIS — C44311 Basal cell carcinoma of skin of nose: Secondary | ICD-10-CM | POA: Diagnosis not present

## 2020-02-21 ENCOUNTER — Ambulatory Visit
Admission: RE | Admit: 2020-02-21 | Discharge: 2020-02-21 | Disposition: A | Discharge status: Home / Self Care | Payer: Medicare Other | Source: Ambulatory Visit | Attending: Internal Medicine | Admitting: Internal Medicine

## 2020-02-21 ENCOUNTER — Other Ambulatory Visit: Payer: Self-pay

## 2020-02-21 DIAGNOSIS — M810 Age-related osteoporosis without current pathological fracture: Secondary | ICD-10-CM

## 2020-03-06 DIAGNOSIS — H35353 Cystoid macular degeneration, bilateral: Secondary | ICD-10-CM | POA: Diagnosis not present

## 2020-03-06 DIAGNOSIS — H43813 Vitreous degeneration, bilateral: Secondary | ICD-10-CM | POA: Diagnosis not present

## 2020-03-06 DIAGNOSIS — H35373 Puckering of macula, bilateral: Secondary | ICD-10-CM | POA: Diagnosis not present

## 2020-03-10 DIAGNOSIS — E7800 Pure hypercholesterolemia, unspecified: Secondary | ICD-10-CM | POA: Diagnosis not present

## 2020-03-20 DIAGNOSIS — E7800 Pure hypercholesterolemia, unspecified: Secondary | ICD-10-CM | POA: Diagnosis not present

## 2020-03-20 DIAGNOSIS — M810 Age-related osteoporosis without current pathological fracture: Secondary | ICD-10-CM | POA: Diagnosis not present

## 2020-04-16 DIAGNOSIS — Z8349 Family history of other endocrine, nutritional and metabolic diseases: Secondary | ICD-10-CM | POA: Diagnosis not present

## 2020-05-14 DIAGNOSIS — Z23 Encounter for immunization: Secondary | ICD-10-CM | POA: Diagnosis not present

## 2020-06-26 DIAGNOSIS — Z23 Encounter for immunization: Secondary | ICD-10-CM | POA: Diagnosis not present

## 2020-07-03 DIAGNOSIS — Z20822 Contact with and (suspected) exposure to covid-19: Secondary | ICD-10-CM | POA: Diagnosis not present

## 2020-11-10 ENCOUNTER — Other Ambulatory Visit: Payer: Self-pay | Admitting: Internal Medicine

## 2020-11-10 DIAGNOSIS — Z1231 Encounter for screening mammogram for malignant neoplasm of breast: Secondary | ICD-10-CM

## 2020-11-18 DIAGNOSIS — R634 Abnormal weight loss: Secondary | ICD-10-CM | POA: Diagnosis not present

## 2020-11-18 DIAGNOSIS — G459 Transient cerebral ischemic attack, unspecified: Secondary | ICD-10-CM | POA: Diagnosis not present

## 2020-11-18 DIAGNOSIS — Z5181 Encounter for therapeutic drug level monitoring: Secondary | ICD-10-CM | POA: Diagnosis not present

## 2020-11-18 DIAGNOSIS — Z79899 Other long term (current) drug therapy: Secondary | ICD-10-CM | POA: Diagnosis not present

## 2020-11-18 DIAGNOSIS — Z7982 Long term (current) use of aspirin: Secondary | ICD-10-CM | POA: Diagnosis not present

## 2020-11-18 DIAGNOSIS — I517 Cardiomegaly: Secondary | ICD-10-CM | POA: Diagnosis not present

## 2020-11-18 DIAGNOSIS — R2990 Unspecified symptoms and signs involving the nervous system: Secondary | ICD-10-CM | POA: Diagnosis not present

## 2020-11-18 DIAGNOSIS — R531 Weakness: Secondary | ICD-10-CM | POA: Diagnosis not present

## 2020-11-18 DIAGNOSIS — I6340 Cerebral infarction due to embolism of unspecified cerebral artery: Secondary | ICD-10-CM | POA: Diagnosis not present

## 2020-11-18 DIAGNOSIS — R9431 Abnormal electrocardiogram [ECG] [EKG]: Secondary | ICD-10-CM | POA: Diagnosis not present

## 2020-11-18 DIAGNOSIS — I10 Essential (primary) hypertension: Secondary | ICD-10-CM | POA: Diagnosis not present

## 2020-11-18 DIAGNOSIS — R4701 Aphasia: Secondary | ICD-10-CM | POA: Diagnosis not present

## 2020-11-18 DIAGNOSIS — R269 Unspecified abnormalities of gait and mobility: Secondary | ICD-10-CM | POA: Diagnosis not present

## 2020-11-18 DIAGNOSIS — R739 Hyperglycemia, unspecified: Secondary | ICD-10-CM | POA: Diagnosis not present

## 2020-11-18 DIAGNOSIS — E162 Hypoglycemia, unspecified: Secondary | ICD-10-CM | POA: Diagnosis not present

## 2020-11-18 DIAGNOSIS — Z87891 Personal history of nicotine dependence: Secondary | ICD-10-CM | POA: Diagnosis not present

## 2020-11-18 DIAGNOSIS — Z20822 Contact with and (suspected) exposure to covid-19: Secondary | ICD-10-CM | POA: Diagnosis not present

## 2020-11-18 DIAGNOSIS — Z7182 Exercise counseling: Secondary | ICD-10-CM | POA: Diagnosis not present

## 2020-11-19 DIAGNOSIS — G459 Transient cerebral ischemic attack, unspecified: Secondary | ICD-10-CM | POA: Diagnosis not present

## 2020-11-19 DIAGNOSIS — R4701 Aphasia: Secondary | ICD-10-CM | POA: Diagnosis not present

## 2020-11-19 DIAGNOSIS — I517 Cardiomegaly: Secondary | ICD-10-CM | POA: Diagnosis not present

## 2020-11-19 DIAGNOSIS — R2990 Unspecified symptoms and signs involving the nervous system: Secondary | ICD-10-CM | POA: Diagnosis not present

## 2020-11-19 DIAGNOSIS — R739 Hyperglycemia, unspecified: Secondary | ICD-10-CM | POA: Diagnosis not present

## 2020-11-19 DIAGNOSIS — I6340 Cerebral infarction due to embolism of unspecified cerebral artery: Secondary | ICD-10-CM | POA: Diagnosis not present

## 2020-11-26 DIAGNOSIS — G459 Transient cerebral ischemic attack, unspecified: Secondary | ICD-10-CM | POA: Diagnosis not present

## 2020-11-26 DIAGNOSIS — E7800 Pure hypercholesterolemia, unspecified: Secondary | ICD-10-CM | POA: Diagnosis not present

## 2020-11-26 DIAGNOSIS — R030 Elevated blood-pressure reading, without diagnosis of hypertension: Secondary | ICD-10-CM | POA: Diagnosis not present

## 2020-12-11 DIAGNOSIS — E7800 Pure hypercholesterolemia, unspecified: Secondary | ICD-10-CM | POA: Diagnosis not present

## 2020-12-11 DIAGNOSIS — Z1389 Encounter for screening for other disorder: Secondary | ICD-10-CM | POA: Diagnosis not present

## 2020-12-11 DIAGNOSIS — Z0000 Encounter for general adult medical examination without abnormal findings: Secondary | ICD-10-CM | POA: Diagnosis not present

## 2020-12-11 DIAGNOSIS — Z8673 Personal history of transient ischemic attack (TIA), and cerebral infarction without residual deficits: Secondary | ICD-10-CM | POA: Diagnosis not present

## 2020-12-11 DIAGNOSIS — Z85820 Personal history of malignant melanoma of skin: Secondary | ICD-10-CM | POA: Diagnosis not present

## 2020-12-11 DIAGNOSIS — M810 Age-related osteoporosis without current pathological fracture: Secondary | ICD-10-CM | POA: Diagnosis not present

## 2020-12-11 DIAGNOSIS — E559 Vitamin D deficiency, unspecified: Secondary | ICD-10-CM | POA: Diagnosis not present

## 2020-12-12 DIAGNOSIS — Z23 Encounter for immunization: Secondary | ICD-10-CM | POA: Diagnosis not present

## 2020-12-15 DIAGNOSIS — E785 Hyperlipidemia, unspecified: Secondary | ICD-10-CM | POA: Diagnosis not present

## 2020-12-15 DIAGNOSIS — G459 Transient cerebral ischemic attack, unspecified: Secondary | ICD-10-CM | POA: Diagnosis not present

## 2020-12-31 ENCOUNTER — Ambulatory Visit
Admission: RE | Admit: 2020-12-31 | Discharge: 2020-12-31 | Disposition: A | Discharge status: Home / Self Care | Payer: Medicare Other | Source: Ambulatory Visit | Attending: Internal Medicine | Admitting: Internal Medicine

## 2020-12-31 ENCOUNTER — Other Ambulatory Visit: Payer: Self-pay

## 2020-12-31 DIAGNOSIS — Z1231 Encounter for screening mammogram for malignant neoplasm of breast: Secondary | ICD-10-CM | POA: Diagnosis not present

## 2021-01-21 DIAGNOSIS — L570 Actinic keratosis: Secondary | ICD-10-CM | POA: Diagnosis not present

## 2021-01-21 DIAGNOSIS — D1801 Hemangioma of skin and subcutaneous tissue: Secondary | ICD-10-CM | POA: Diagnosis not present

## 2021-01-21 DIAGNOSIS — L814 Other melanin hyperpigmentation: Secondary | ICD-10-CM | POA: Diagnosis not present

## 2021-01-21 DIAGNOSIS — D225 Melanocytic nevi of trunk: Secondary | ICD-10-CM | POA: Diagnosis not present

## 2021-01-21 DIAGNOSIS — L821 Other seborrheic keratosis: Secondary | ICD-10-CM | POA: Diagnosis not present

## 2021-01-21 DIAGNOSIS — Z85828 Personal history of other malignant neoplasm of skin: Secondary | ICD-10-CM | POA: Diagnosis not present

## 2021-01-21 DIAGNOSIS — Z85820 Personal history of malignant melanoma of skin: Secondary | ICD-10-CM | POA: Diagnosis not present

## 2021-04-13 DIAGNOSIS — Z20822 Contact with and (suspected) exposure to covid-19: Secondary | ICD-10-CM | POA: Diagnosis not present

## 2021-05-26 DIAGNOSIS — Z23 Encounter for immunization: Secondary | ICD-10-CM | POA: Diagnosis not present

## 2021-06-22 DIAGNOSIS — Z23 Encounter for immunization: Secondary | ICD-10-CM | POA: Diagnosis not present

## 2021-11-30 DIAGNOSIS — H43812 Vitreous degeneration, left eye: Secondary | ICD-10-CM | POA: Diagnosis not present

## 2021-11-30 DIAGNOSIS — H35373 Puckering of macula, bilateral: Secondary | ICD-10-CM | POA: Diagnosis not present

## 2021-12-22 DIAGNOSIS — E7800 Pure hypercholesterolemia, unspecified: Secondary | ICD-10-CM | POA: Diagnosis not present

## 2021-12-22 DIAGNOSIS — E559 Vitamin D deficiency, unspecified: Secondary | ICD-10-CM | POA: Diagnosis not present

## 2021-12-22 DIAGNOSIS — R030 Elevated blood-pressure reading, without diagnosis of hypertension: Secondary | ICD-10-CM | POA: Diagnosis not present

## 2021-12-22 DIAGNOSIS — Z85820 Personal history of malignant melanoma of skin: Secondary | ICD-10-CM | POA: Diagnosis not present

## 2021-12-22 DIAGNOSIS — M810 Age-related osteoporosis without current pathological fracture: Secondary | ICD-10-CM | POA: Diagnosis not present

## 2022-01-20 DIAGNOSIS — D1801 Hemangioma of skin and subcutaneous tissue: Secondary | ICD-10-CM | POA: Diagnosis not present

## 2022-01-20 DIAGNOSIS — Z85828 Personal history of other malignant neoplasm of skin: Secondary | ICD-10-CM | POA: Diagnosis not present

## 2022-01-20 DIAGNOSIS — L814 Other melanin hyperpigmentation: Secondary | ICD-10-CM | POA: Diagnosis not present

## 2022-01-20 DIAGNOSIS — D225 Melanocytic nevi of trunk: Secondary | ICD-10-CM | POA: Diagnosis not present

## 2022-01-20 DIAGNOSIS — L918 Other hypertrophic disorders of the skin: Secondary | ICD-10-CM | POA: Diagnosis not present

## 2022-01-20 DIAGNOSIS — Z85820 Personal history of malignant melanoma of skin: Secondary | ICD-10-CM | POA: Diagnosis not present

## 2022-01-20 DIAGNOSIS — L821 Other seborrheic keratosis: Secondary | ICD-10-CM | POA: Diagnosis not present

## 2022-02-04 DIAGNOSIS — H43813 Vitreous degeneration, bilateral: Secondary | ICD-10-CM | POA: Diagnosis not present

## 2022-02-04 DIAGNOSIS — H11003 Unspecified pterygium of eye, bilateral: Secondary | ICD-10-CM | POA: Diagnosis not present

## 2022-02-04 DIAGNOSIS — H26491 Other secondary cataract, right eye: Secondary | ICD-10-CM | POA: Diagnosis not present

## 2022-02-04 DIAGNOSIS — Z83518 Family history of other specified eye disorder: Secondary | ICD-10-CM | POA: Diagnosis not present

## 2022-02-04 DIAGNOSIS — H02833 Dermatochalasis of right eye, unspecified eyelid: Secondary | ICD-10-CM | POA: Diagnosis not present

## 2022-02-04 DIAGNOSIS — Z961 Presence of intraocular lens: Secondary | ICD-10-CM | POA: Diagnosis not present

## 2022-02-04 DIAGNOSIS — H35373 Puckering of macula, bilateral: Secondary | ICD-10-CM | POA: Diagnosis not present

## 2022-02-04 DIAGNOSIS — H26493 Other secondary cataract, bilateral: Secondary | ICD-10-CM | POA: Diagnosis not present

## 2022-02-04 DIAGNOSIS — Z8669 Personal history of other diseases of the nervous system and sense organs: Secondary | ICD-10-CM | POA: Diagnosis not present

## 2022-03-09 ENCOUNTER — Other Ambulatory Visit: Payer: Self-pay | Admitting: Internal Medicine

## 2022-03-09 DIAGNOSIS — Z1231 Encounter for screening mammogram for malignant neoplasm of breast: Secondary | ICD-10-CM

## 2022-03-10 ENCOUNTER — Ambulatory Visit
Admission: RE | Admit: 2022-03-10 | Discharge: 2022-03-10 | Disposition: A | Discharge status: Home / Self Care | Payer: Medicare Other | Source: Ambulatory Visit | Attending: Internal Medicine | Admitting: Internal Medicine

## 2022-03-10 DIAGNOSIS — Z1231 Encounter for screening mammogram for malignant neoplasm of breast: Secondary | ICD-10-CM | POA: Diagnosis not present

## 2022-03-11 IMAGING — MG MM DIGITAL SCREENING BILAT W/ TOMO AND CAD
6 of 10 series · 6 of 30 positions shown · non-contrast
Comparison: Previous exam(s).

CLINICAL DATA: Screening.

EXAM:
DIGITAL SCREENING BILATERAL MAMMOGRAM WITH TOMOSYNTHESIS AND CAD
TECHNIQUE: Bilateral screening digital craniocaudal and mediolateral oblique
mammograms were obtained. Bilateral screening digital breast
tomosynthesis was performed. The images were evaluated with
computer-aided detection.

[R CC synth-2D]
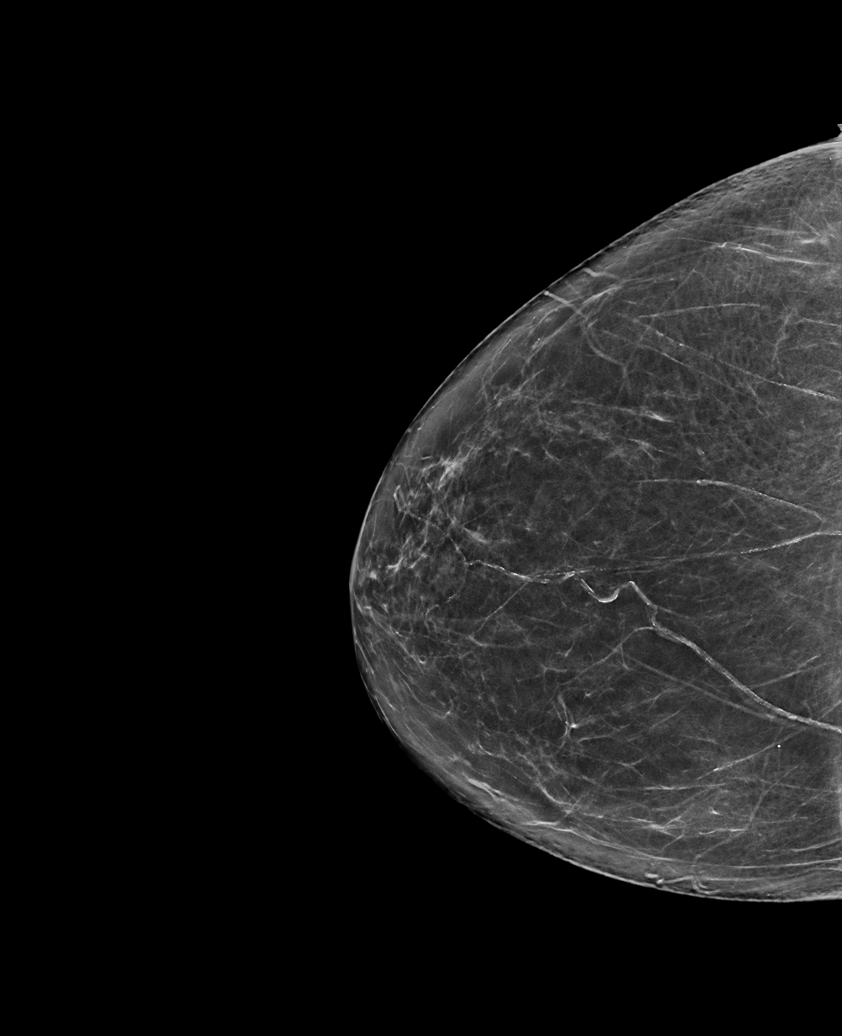

[R MLO synth-2D (1 of 2)]
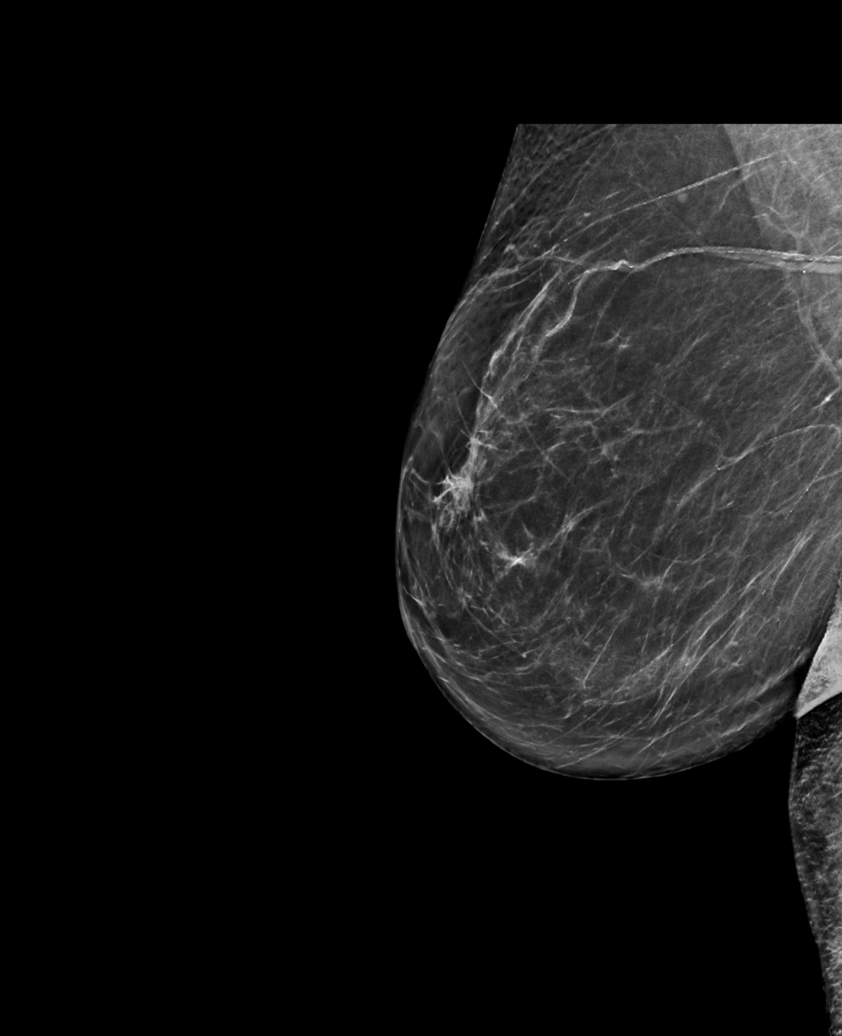

[L CC synth-2D]
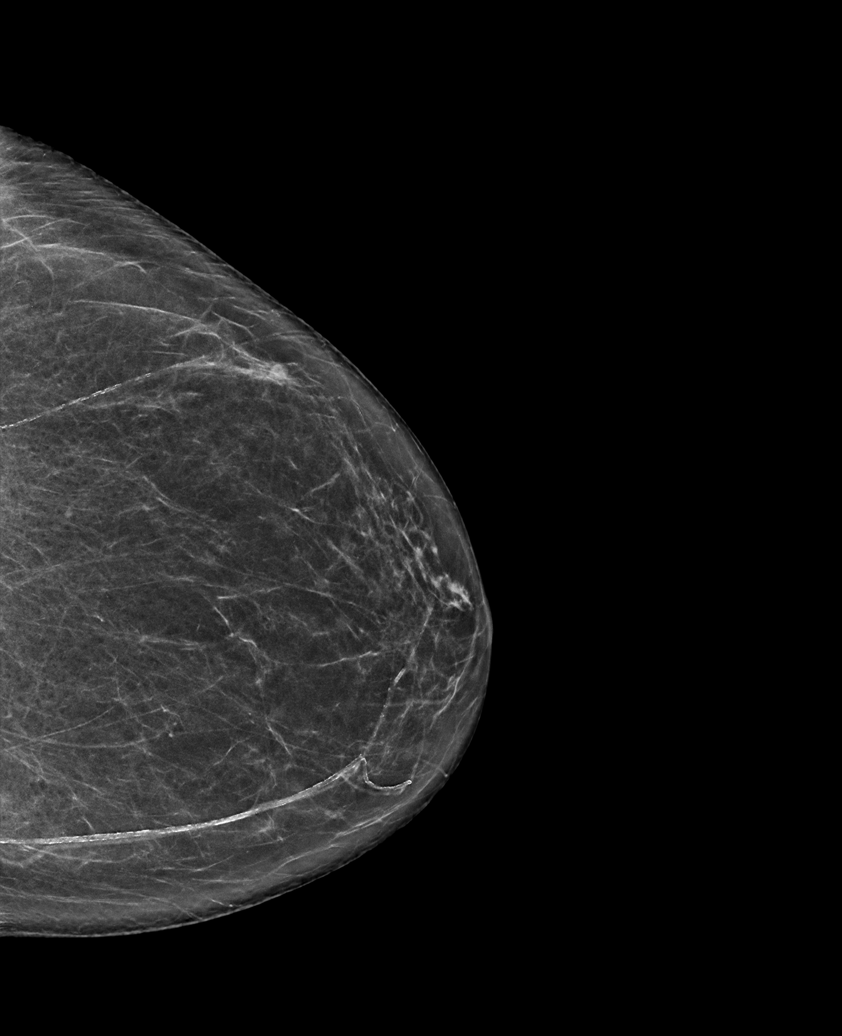

[R MLO synth-2D (2 of 2)]
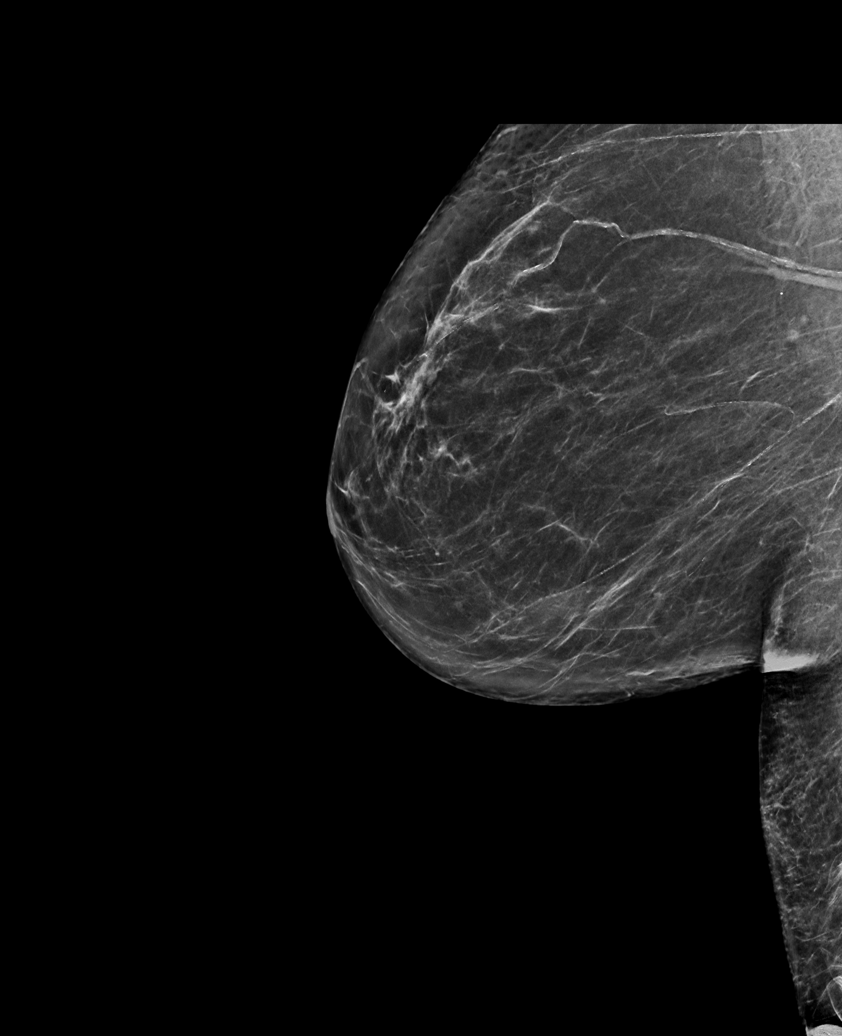

[L MLO synth-2D]
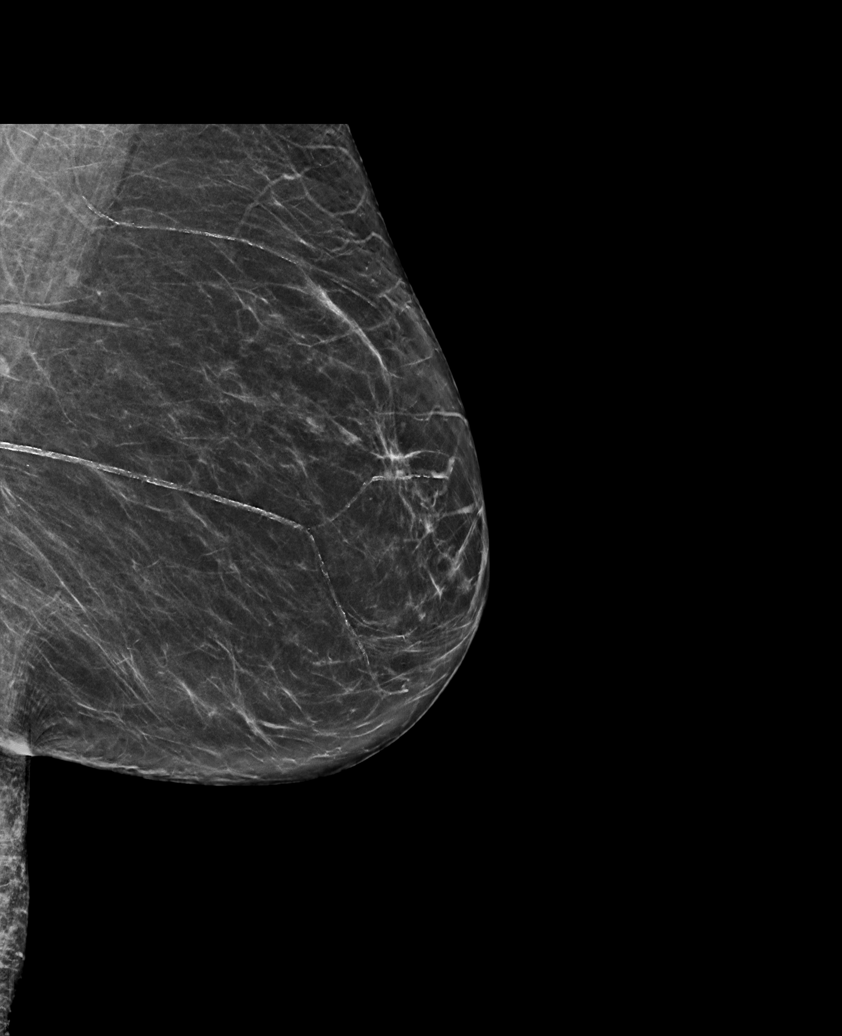

[L CC tomo · tomo slice 33/66.0]
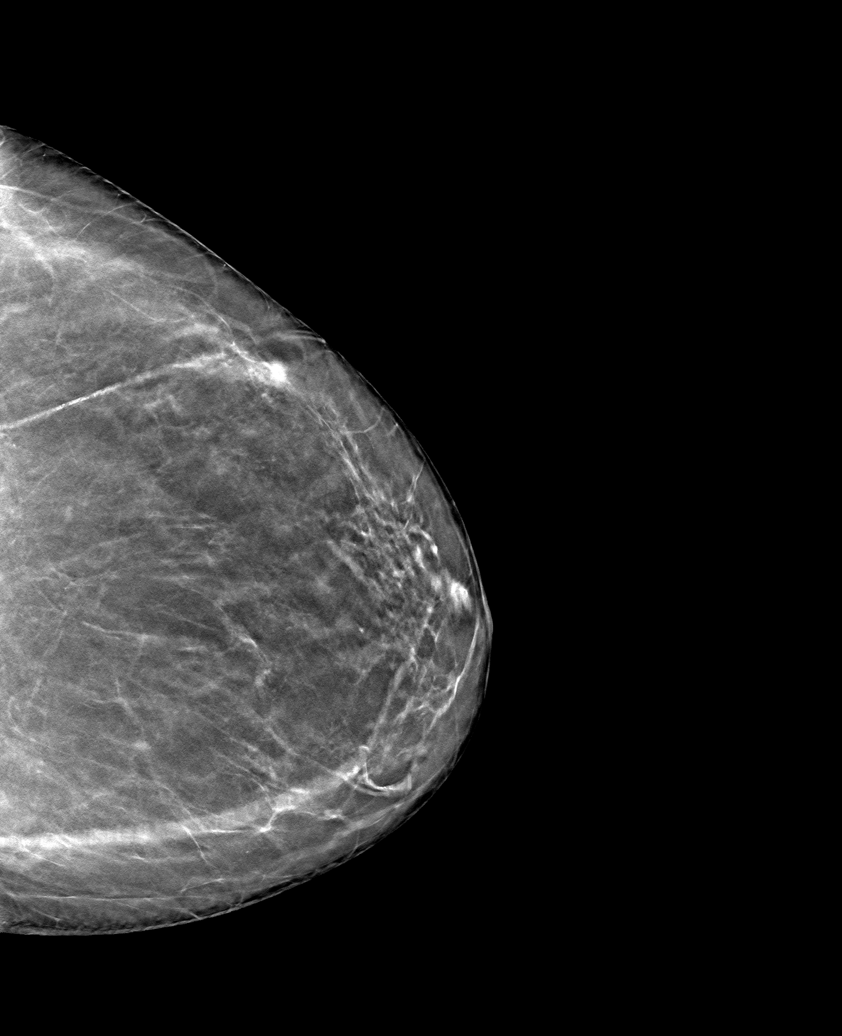

[6 of 30 positions shown; findings below may reference images not displayed]

ACR Breast Density Category b: There are scattered areas of
fibroglandular density.
FINDINGS: There are no findings suspicious for malignancy. The images were
evaluated with computer-aided detection.
IMPRESSION: No mammographic evidence of malignancy. A result letter of this
screening mammogram will be mailed directly to the patient.

RECOMMENDATION:
Screening mammogram in one year. (Code:[id number])

BI-RADS CATEGORY  1: Negative.

## 2022-08-26 DIAGNOSIS — H35373 Puckering of macula, bilateral: Secondary | ICD-10-CM | POA: Diagnosis not present

## 2022-08-26 DIAGNOSIS — H43812 Vitreous degeneration, left eye: Secondary | ICD-10-CM | POA: Diagnosis not present

## 2022-08-26 DIAGNOSIS — Z83518 Family history of other specified eye disorder: Secondary | ICD-10-CM | POA: Diagnosis not present

## 2022-08-26 DIAGNOSIS — H26491 Other secondary cataract, right eye: Secondary | ICD-10-CM | POA: Diagnosis not present

## 2022-09-17 DIAGNOSIS — H26491 Other secondary cataract, right eye: Secondary | ICD-10-CM | POA: Diagnosis not present

## 2022-11-03 ENCOUNTER — Ambulatory Visit
Admission: RE | Admit: 2022-11-03 | Discharge: 2022-11-03 | Disposition: A | Discharge status: Home / Self Care | Payer: Medicare Other | Source: Ambulatory Visit | Attending: Internal Medicine | Admitting: Internal Medicine

## 2022-11-03 ENCOUNTER — Other Ambulatory Visit: Payer: Self-pay | Admitting: Internal Medicine

## 2022-11-03 DIAGNOSIS — R0781 Pleurodynia: Secondary | ICD-10-CM

## 2022-11-03 DIAGNOSIS — I700 Atherosclerosis of aorta: Secondary | ICD-10-CM | POA: Diagnosis not present

## 2022-11-03 DIAGNOSIS — I10 Essential (primary) hypertension: Secondary | ICD-10-CM | POA: Diagnosis not present

## 2022-11-17 DIAGNOSIS — I10 Essential (primary) hypertension: Secondary | ICD-10-CM | POA: Diagnosis not present

## 2022-12-30 DIAGNOSIS — I10 Essential (primary) hypertension: Secondary | ICD-10-CM | POA: Diagnosis not present

## 2022-12-30 DIAGNOSIS — E7800 Pure hypercholesterolemia, unspecified: Secondary | ICD-10-CM | POA: Diagnosis not present

## 2022-12-30 DIAGNOSIS — Z85820 Personal history of malignant melanoma of skin: Secondary | ICD-10-CM | POA: Diagnosis not present

## 2022-12-30 DIAGNOSIS — E559 Vitamin D deficiency, unspecified: Secondary | ICD-10-CM | POA: Diagnosis not present

## 2022-12-30 DIAGNOSIS — Z79899 Other long term (current) drug therapy: Secondary | ICD-10-CM | POA: Diagnosis not present

## 2022-12-30 DIAGNOSIS — Z8673 Personal history of transient ischemic attack (TIA), and cerebral infarction without residual deficits: Secondary | ICD-10-CM | POA: Diagnosis not present

## 2022-12-30 DIAGNOSIS — Z0000 Encounter for general adult medical examination without abnormal findings: Secondary | ICD-10-CM | POA: Diagnosis not present

## 2022-12-30 DIAGNOSIS — M810 Age-related osteoporosis without current pathological fracture: Secondary | ICD-10-CM | POA: Diagnosis not present

## 2022-12-30 DIAGNOSIS — Z1331 Encounter for screening for depression: Secondary | ICD-10-CM | POA: Diagnosis not present

## 2023-01-25 DIAGNOSIS — L738 Other specified follicular disorders: Secondary | ICD-10-CM | POA: Diagnosis not present

## 2023-01-25 DIAGNOSIS — L821 Other seborrheic keratosis: Secondary | ICD-10-CM | POA: Diagnosis not present

## 2023-01-25 DIAGNOSIS — Z85828 Personal history of other malignant neoplasm of skin: Secondary | ICD-10-CM | POA: Diagnosis not present

## 2023-01-25 DIAGNOSIS — D2262 Melanocytic nevi of left upper limb, including shoulder: Secondary | ICD-10-CM | POA: Diagnosis not present

## 2023-01-25 DIAGNOSIS — Z85820 Personal history of malignant melanoma of skin: Secondary | ICD-10-CM | POA: Diagnosis not present

## 2023-01-25 DIAGNOSIS — L814 Other melanin hyperpigmentation: Secondary | ICD-10-CM | POA: Diagnosis not present

## 2023-01-25 DIAGNOSIS — I788 Other diseases of capillaries: Secondary | ICD-10-CM | POA: Diagnosis not present

## 2023-01-25 DIAGNOSIS — D225 Melanocytic nevi of trunk: Secondary | ICD-10-CM | POA: Diagnosis not present

## 2023-01-25 DIAGNOSIS — D1801 Hemangioma of skin and subcutaneous tissue: Secondary | ICD-10-CM | POA: Diagnosis not present

## 2023-02-24 DIAGNOSIS — H43812 Vitreous degeneration, left eye: Secondary | ICD-10-CM | POA: Diagnosis not present

## 2023-02-24 DIAGNOSIS — H35373 Puckering of macula, bilateral: Secondary | ICD-10-CM | POA: Diagnosis not present

## 2023-02-24 DIAGNOSIS — Z83518 Family history of other specified eye disorder: Secondary | ICD-10-CM | POA: Diagnosis not present

## 2023-04-21 ENCOUNTER — Other Ambulatory Visit: Payer: Self-pay | Admitting: Internal Medicine

## 2023-04-21 DIAGNOSIS — Z1231 Encounter for screening mammogram for malignant neoplasm of breast: Secondary | ICD-10-CM

## 2023-05-03 DIAGNOSIS — D230 Other benign neoplasm of skin of lip: Secondary | ICD-10-CM | POA: Diagnosis not present

## 2023-05-03 DIAGNOSIS — D692 Other nonthrombocytopenic purpura: Secondary | ICD-10-CM | POA: Diagnosis not present

## 2023-05-03 DIAGNOSIS — Z85820 Personal history of malignant melanoma of skin: Secondary | ICD-10-CM | POA: Diagnosis not present

## 2023-05-03 DIAGNOSIS — Z85828 Personal history of other malignant neoplasm of skin: Secondary | ICD-10-CM | POA: Diagnosis not present

## 2023-05-04 ENCOUNTER — Ambulatory Visit
Admission: RE | Admit: 2023-05-04 | Discharge: 2023-05-04 | Disposition: A | Discharge status: Home / Self Care | Payer: Medicare Other | Source: Ambulatory Visit | Attending: Internal Medicine | Admitting: Internal Medicine

## 2023-05-04 DIAGNOSIS — Z1231 Encounter for screening mammogram for malignant neoplasm of breast: Secondary | ICD-10-CM | POA: Diagnosis not present

## 2023-06-23 DIAGNOSIS — K219 Gastro-esophageal reflux disease without esophagitis: Secondary | ICD-10-CM | POA: Diagnosis not present

## 2023-06-23 DIAGNOSIS — Z860101 Personal history of adenomatous and serrated colon polyps: Secondary | ICD-10-CM | POA: Diagnosis not present

## 2023-07-07 DIAGNOSIS — I10 Essential (primary) hypertension: Secondary | ICD-10-CM | POA: Diagnosis not present

## 2023-07-07 DIAGNOSIS — Z23 Encounter for immunization: Secondary | ICD-10-CM | POA: Diagnosis not present

## 2023-07-07 DIAGNOSIS — Z8673 Personal history of transient ischemic attack (TIA), and cerebral infarction without residual deficits: Secondary | ICD-10-CM | POA: Diagnosis not present

## 2023-09-01 DIAGNOSIS — H02883 Meibomian gland dysfunction of right eye, unspecified eyelid: Secondary | ICD-10-CM | POA: Diagnosis not present

## 2023-09-01 DIAGNOSIS — Z83518 Family history of other specified eye disorder: Secondary | ICD-10-CM | POA: Diagnosis not present

## 2023-09-01 DIAGNOSIS — Z961 Presence of intraocular lens: Secondary | ICD-10-CM | POA: Diagnosis not present

## 2023-09-01 DIAGNOSIS — H35373 Puckering of macula, bilateral: Secondary | ICD-10-CM | POA: Diagnosis not present

## 2023-09-01 DIAGNOSIS — H43812 Vitreous degeneration, left eye: Secondary | ICD-10-CM | POA: Diagnosis not present

## 2023-09-27 DIAGNOSIS — Z860100 Personal history of colon polyps, unspecified: Secondary | ICD-10-CM | POA: Diagnosis not present

## 2023-09-27 DIAGNOSIS — Z09 Encounter for follow-up examination after completed treatment for conditions other than malignant neoplasm: Secondary | ICD-10-CM | POA: Diagnosis not present

## 2023-09-27 DIAGNOSIS — K5730 Diverticulosis of large intestine without perforation or abscess without bleeding: Secondary | ICD-10-CM | POA: Diagnosis not present

## 2023-12-09 DIAGNOSIS — D224 Melanocytic nevi of scalp and neck: Secondary | ICD-10-CM | POA: Diagnosis not present

## 2023-12-09 DIAGNOSIS — D1801 Hemangioma of skin and subcutaneous tissue: Secondary | ICD-10-CM | POA: Diagnosis not present

## 2023-12-09 DIAGNOSIS — Z85828 Personal history of other malignant neoplasm of skin: Secondary | ICD-10-CM | POA: Diagnosis not present

## 2023-12-09 DIAGNOSIS — Z85820 Personal history of malignant melanoma of skin: Secondary | ICD-10-CM | POA: Diagnosis not present

## 2024-01-04 DIAGNOSIS — Z85820 Personal history of malignant melanoma of skin: Secondary | ICD-10-CM | POA: Diagnosis not present

## 2024-01-04 DIAGNOSIS — I10 Essential (primary) hypertension: Secondary | ICD-10-CM | POA: Diagnosis not present

## 2024-01-04 DIAGNOSIS — M810 Age-related osteoporosis without current pathological fracture: Secondary | ICD-10-CM | POA: Diagnosis not present

## 2024-01-04 DIAGNOSIS — Z8673 Personal history of transient ischemic attack (TIA), and cerebral infarction without residual deficits: Secondary | ICD-10-CM | POA: Diagnosis not present

## 2024-01-04 DIAGNOSIS — E559 Vitamin D deficiency, unspecified: Secondary | ICD-10-CM | POA: Diagnosis not present

## 2024-01-04 DIAGNOSIS — R0981 Nasal congestion: Secondary | ICD-10-CM | POA: Diagnosis not present

## 2024-01-04 DIAGNOSIS — E7800 Pure hypercholesterolemia, unspecified: Secondary | ICD-10-CM | POA: Diagnosis not present

## 2024-01-04 DIAGNOSIS — Z0000 Encounter for general adult medical examination without abnormal findings: Secondary | ICD-10-CM | POA: Diagnosis not present

## 2024-01-04 DIAGNOSIS — Z79899 Other long term (current) drug therapy: Secondary | ICD-10-CM | POA: Diagnosis not present

## 2024-01-04 DIAGNOSIS — Z1331 Encounter for screening for depression: Secondary | ICD-10-CM | POA: Diagnosis not present

## 2024-02-06 DIAGNOSIS — E7800 Pure hypercholesterolemia, unspecified: Secondary | ICD-10-CM | POA: Diagnosis not present

## 2024-02-07 DIAGNOSIS — Z85828 Personal history of other malignant neoplasm of skin: Secondary | ICD-10-CM | POA: Diagnosis not present

## 2024-02-07 DIAGNOSIS — D225 Melanocytic nevi of trunk: Secondary | ICD-10-CM | POA: Diagnosis not present

## 2024-02-07 DIAGNOSIS — L814 Other melanin hyperpigmentation: Secondary | ICD-10-CM | POA: Diagnosis not present

## 2024-02-07 DIAGNOSIS — B078 Other viral warts: Secondary | ICD-10-CM | POA: Diagnosis not present

## 2024-02-07 DIAGNOSIS — Z85820 Personal history of malignant melanoma of skin: Secondary | ICD-10-CM | POA: Diagnosis not present

## 2024-02-07 DIAGNOSIS — D485 Neoplasm of uncertain behavior of skin: Secondary | ICD-10-CM | POA: Diagnosis not present

## 2024-02-07 DIAGNOSIS — D692 Other nonthrombocytopenic purpura: Secondary | ICD-10-CM | POA: Diagnosis not present

## 2024-02-07 DIAGNOSIS — D1801 Hemangioma of skin and subcutaneous tissue: Secondary | ICD-10-CM | POA: Diagnosis not present

## 2024-02-07 DIAGNOSIS — L821 Other seborrheic keratosis: Secondary | ICD-10-CM | POA: Diagnosis not present

## 2024-03-22 ENCOUNTER — Encounter (INDEPENDENT_AMBULATORY_CARE_PROVIDER_SITE_OTHER): Payer: Self-pay | Admitting: Otolaryngology

## 2024-03-22 ENCOUNTER — Ambulatory Visit (INDEPENDENT_AMBULATORY_CARE_PROVIDER_SITE_OTHER): Admitting: Otolaryngology

## 2024-03-22 VITALS — HR 77

## 2024-03-22 DIAGNOSIS — J3489 Other specified disorders of nose and nasal sinuses: Secondary | ICD-10-CM | POA: Diagnosis not present

## 2024-03-22 DIAGNOSIS — J342 Deviated nasal septum: Secondary | ICD-10-CM

## 2024-03-22 DIAGNOSIS — R0981 Nasal congestion: Secondary | ICD-10-CM | POA: Diagnosis not present

## 2024-03-22 DIAGNOSIS — R0982 Postnasal drip: Secondary | ICD-10-CM

## 2024-03-22 DIAGNOSIS — Z87891 Personal history of nicotine dependence: Secondary | ICD-10-CM | POA: Diagnosis not present

## 2024-03-22 DIAGNOSIS — J3089 Other allergic rhinitis: Secondary | ICD-10-CM

## 2024-03-22 DIAGNOSIS — J343 Hypertrophy of nasal turbinates: Secondary | ICD-10-CM

## 2024-03-22 MED ORDER — FLUTICASONE PROPIONATE 50 MCG/ACT NA SUSP: 2.0000 | Freq: Two times a day (BID) | NASAL | 6 refills | Status: AC

## 2024-03-22 NOTE — Progress Notes (Signed)
 ENT CONSULT:  Reason for Consult: chronic right sided nasal congestion and obstruction   HPI: Discussed the use of AI scribe software for clinical note transcription with the patient, who gave verbal consent to proceed.  History of Present Illness Madison Briggs is a 78 year old female who presents with right-sided chronic nasal congestion and obstruction and difficulty breathing through the right nostril.  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 The symptoms are particularly bothersome when lying on her left side, as the side of her nose presses against the inside of her nose, making it difficult to breathe.  She denies any formally tested environmental allergies but suspects dust might be a trigger. She has not used any nasal sprays or allergy medications to address the congestion. She has not had any other nasal surgeries aside from the basal cell removal. She feels the nostril 'collapses' when she inhales.  She experiences some stuffiness and congestion and has wondered if nasal polyps could be a cause. Additionally, her nose runs when she eats hot food. She reports poor sense of smell.    Records Reviewed:  [facility] at [facility]/18/20 for TIA   History reviewed. No pertinent past medical history.  Past Surgical History:  Procedure Laterality Date   APPENDECTOMY     WISDOM TOOTH EXTRACTION      Family History  Problem Relation Age of Onset   Breast cancer Sister 36       2x    Social History:  reports that she quit smoking about 38 years ago. She does not have any smokeless tobacco history on file. She reports current alcohol use. No history on file for drug use.  Allergies:  Allergies  Allergen Reactions   Corn Oil Anxiety    Depression   Corn Starch Anxiety    Depression    Medications: I have reviewed the patient's  current medications.  The PMH, PSH, Medications, Allergies, and SH were reviewed and updated.  ROS: Constitutional: Negative for fever, weight loss and weight gain. Cardiovascular: Negative for chest pain and dyspnea on exertion. Respiratory: Is not experiencing shortness of breath at rest. Gastrointestinal: Negative for nausea and vomiting. Neurological: Negative for headaches. Psychiatric: The patient is not nervous/anxious  Pulse 77, SpO2 92%. There is no height or weight on file to calculate BMI.  PHYSICAL EXAM:  Exam: General: Well-developed, well-nourished Respiratory Respiratory effort: Equal inspiration and expiration without stridor Cardiovascular Peripheral Vascular: Warm extremities with equal color/perfusion Eyes: No nystagmus with equal extraocular motion bilaterally Neuro/Psych/Balance: Patient oriented to person, place, and time; Appropriate mood and affect; Gait is intact with no imbalance; Cranial nerves I-XII are intact Head and Face Inspection: Normocephalic and atraumatic without mass or lesion Palpation: Facial skeleton intact without bony stepoffs Salivary Glands: No mass or tenderness Facial Strength: Facial motility symmetric and full bilaterally ENT Pinna: External ear intact and fully developed External canal: Canal is patent with intact skin Tympanic Membrane: Clear and mobile External Nose: No scar or anatomic deformity Internal Nose: Septum is relatively straight. No polyp, or purulence. Mucosal edema and erythema present.  Bilateral inferior turbinate hypertrophy. (-) Cottle maneuver. Lips, Teeth, and gums: Mucosa and teeth intact and viable TMJ: No pain to palpation with full mobility Oral cavity/oropharynx: No erythema or exudate, no lesions present Nasopharynx: No mass or lesion with intact mucosa Hypopharynx: Intact mucosa without pooling  of secretions Larynx Glottic: Full true vocal cord mobility without lesion or mass Supraglottic: Normal  appearing epiglottis and AE folds Interarytenoid Space: Moderate pachydermia&edema Subglottic Space: Patent without lesion or edema Neck Neck and Trachea: Midline trachea without mass or lesion Thyroid : No mass or nodularity Lymphatics: No lymphadenopathy  Procedure:   PROCEDURE NOTE: nasal endoscopy  Preoperative diagnosis: chronic nasal congestion symptoms  Postoperative diagnosis: same  Procedure: Diagnostic nasal endoscopy (68768)  Surgeon: Elena Larry, M.D.  Anesthesia: Topical lidocaine and Afrin  H&P REVIEW: The patient's history and physical were reviewed today prior to procedure. All medications were reviewed and updated as well. Complications: None Condition is stable throughout exam Indications and consent: The patient presents with symptoms of chronic sinusitis not responding to previous therapies. All the risks, benefits, and potential complications were reviewed with the patient preoperatively and informed consent was obtained. The time out was completed with confirmation of the correct procedure.   Procedure: The patient was seated upright in the clinic. Topical lidocaine and Afrin were applied to the nasal cavity. After adequate anesthesia had occurred, the rigid nasal endoscope was passed into the nasal cavity. The nasal mucosa, turbinates, septum, and sinus drainage pathways were visualized bilaterally. This revealed no purulence or significant secretions that might be cultured. There were no polyps or sites of significant inflammation. The mucosa was intact and there was no crusting present. The scope was then slowly withdrawn and the patient tolerated the procedure well. There were no complications or blood loss.    Assessment/Plan: Encounter Diagnoses  Name Primary?   Chronic nasal congestion Yes   Hypertrophy of both inferior nasal turbinates    Post-nasal drip    Environmental and seasonal allergies    Nasal septal deviation    Nasal obstruction      Assessment and Plan Assessment & Plan Chronic nasal congestion right sided nasal obstruction  Chronic right-sided nasal congestion/obstruction, worsens when lying on the left. No significant anatomical blockage or dynamic collapse noted on exam including nasal endoscopy. No nasal polyps. Alar framework appears intact on exam. Possible physiologic nasal cycle involvement. Loss of smell may be due to chronic nasal congestion. - Prescribed Flonase  nasal spray, twice daily. - Recommended nasal saline rinses with Aureliano Med bottle, distilled water, and salt packet, once or twice daily.  Chronic nasal congestion and suspected environmental allergies Evidence of post-nasal drainage during flexible scope exam today, could be contributing to her sx - Flonase  2 puffs b/l nares BID - consider nasal saline rinses       Thank you for allowing me to participate in the care of this patient. Please do not hesitate to contact me with any questions or concerns.   Elena Larry, MD Otolaryngology [facility] Phone: [phone] Fax: [phone]    03/22/2024, 9:42 AM

## 2024-03-22 NOTE — Patient Instructions (Signed)
 Lloyd Huger Med Nasal Saline Rinse   - start nasal saline rinses with NeilMed Bottle available over the counter or online to help with nasal congestion

## 2024-04-05 ENCOUNTER — Other Ambulatory Visit: Payer: Self-pay | Admitting: Internal Medicine

## 2024-04-05 DIAGNOSIS — Z1231 Encounter for screening mammogram for malignant neoplasm of breast: Secondary | ICD-10-CM

## 2024-04-06 ENCOUNTER — Other Ambulatory Visit: Payer: Self-pay | Admitting: Internal Medicine

## 2024-04-06 DIAGNOSIS — N644 Mastodynia: Secondary | ICD-10-CM

## 2024-05-04 ENCOUNTER — Ambulatory Visit
Admission: RE | Admit: 2024-05-04 | Discharge: 2024-05-04 | Disposition: A | Discharge status: Home / Self Care | Source: Ambulatory Visit | Attending: Internal Medicine | Admitting: Internal Medicine

## 2024-05-04 DIAGNOSIS — N644 Mastodynia: Secondary | ICD-10-CM

## 2024-05-04 DIAGNOSIS — R928 Other abnormal and inconclusive findings on diagnostic imaging of breast: Secondary | ICD-10-CM | POA: Diagnosis not present

## 2024-07-03 DIAGNOSIS — Z23 Encounter for immunization: Secondary | ICD-10-CM | POA: Diagnosis not present

## 2024-07-03 DIAGNOSIS — Z8673 Personal history of transient ischemic attack (TIA), and cerebral infarction without residual deficits: Secondary | ICD-10-CM | POA: Diagnosis not present

## 2024-07-03 DIAGNOSIS — I10 Essential (primary) hypertension: Secondary | ICD-10-CM | POA: Diagnosis not present
